# Patient Record
Sex: Female | Born: 1985 | Race: White | Hispanic: No | Marital: Married | Smoking: Never smoker
Health system: Southern US, Community
[De-identification: ages and names within clinical notes are randomized; demographics above are authoritative.]

## PROBLEM LIST (undated history)

## (undated) ENCOUNTER — Emergency Department (HOSPITAL_COMMUNITY): Discharge status: Against Medical Advice | Payer: Self-pay

## (undated) DIAGNOSIS — Z87898 Personal history of other specified conditions: Secondary | ICD-10-CM

## (undated) DIAGNOSIS — E041 Nontoxic single thyroid nodule: Secondary | ICD-10-CM

## (undated) DIAGNOSIS — K625 Hemorrhage of anus and rectum: Secondary | ICD-10-CM

## (undated) DIAGNOSIS — N8000 Endometriosis of the uterus, unspecified: Secondary | ICD-10-CM

## (undated) DIAGNOSIS — K219 Gastro-esophageal reflux disease without esophagitis: Secondary | ICD-10-CM

## (undated) DIAGNOSIS — M779 Enthesopathy, unspecified: Secondary | ICD-10-CM

## (undated) DIAGNOSIS — R87629 Unspecified abnormal cytological findings in specimens from vagina: Secondary | ICD-10-CM

## (undated) DIAGNOSIS — T4145XA Adverse effect of unspecified anesthetic, initial encounter: Secondary | ICD-10-CM

## (undated) DIAGNOSIS — T8859XA Other complications of anesthesia, initial encounter: Secondary | ICD-10-CM

## (undated) HISTORY — DX: Hemorrhage of anus and rectum: K62.5

## (undated) HISTORY — PX: TONSILLECTOMY: SUR1361

## (undated) HISTORY — DX: Unspecified abnormal cytological findings in specimens from vagina: R87.629

## (undated) HISTORY — DX: Nontoxic single thyroid nodule: E04.1

## (undated) HISTORY — DX: Personal history of other specified conditions: Z87.898

## (undated) HISTORY — DX: Enthesopathy, unspecified: M77.9

---
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
## 2006-01-07 DIAGNOSIS — Z87898 Personal history of other specified conditions: Secondary | ICD-10-CM

## 2006-01-07 HISTORY — DX: Personal history of other specified conditions: Z87.898

## 2006-06-19 ENCOUNTER — Ambulatory Visit: Payer: Self-pay | Admitting: Unknown Physician Specialty

## 2006-11-25 ENCOUNTER — Ambulatory Visit: Payer: Self-pay | Admitting: Family Medicine

## 2008-01-06 ENCOUNTER — Ambulatory Visit: Payer: Self-pay | Admitting: Family Medicine

## 2008-01-08 DIAGNOSIS — E041 Nontoxic single thyroid nodule: Secondary | ICD-10-CM

## 2008-01-08 HISTORY — DX: Nontoxic single thyroid nodule: E04.1

## 2008-02-03 ENCOUNTER — Ambulatory Visit: Payer: Self-pay | Admitting: Family Medicine

## 2008-12-29 ENCOUNTER — Ambulatory Visit: Payer: Self-pay | Admitting: Internal Medicine

## 2009-01-02 ENCOUNTER — Ambulatory Visit: Payer: Self-pay | Admitting: Internal Medicine

## 2009-05-04 ENCOUNTER — Emergency Department: Payer: Self-pay | Admitting: Emergency Medicine

## 2009-05-05 ENCOUNTER — Ambulatory Visit: Payer: Self-pay | Admitting: Family Medicine

## 2009-05-29 ENCOUNTER — Ambulatory Visit: Payer: Self-pay | Admitting: Otolaryngology

## 2009-06-30 ENCOUNTER — Ambulatory Visit: Payer: Self-pay | Admitting: Unknown Physician Specialty

## 2009-08-11 ENCOUNTER — Ambulatory Visit: Payer: Self-pay | Admitting: Unknown Physician Specialty

## 2010-06-01 ENCOUNTER — Ambulatory Visit: Payer: Self-pay | Admitting: Otolaryngology

## 2010-07-06 ENCOUNTER — Ambulatory Visit: Payer: Self-pay | Admitting: Internal Medicine

## 2010-07-16 ENCOUNTER — Ambulatory Visit: Payer: Self-pay | Admitting: Internal Medicine

## 2011-04-08 ENCOUNTER — Ambulatory Visit: Payer: Self-pay

## 2011-06-14 ENCOUNTER — Ambulatory Visit: Payer: Self-pay | Admitting: Otolaryngology

## 2011-07-02 ENCOUNTER — Other Ambulatory Visit: Payer: Self-pay | Admitting: *Deleted

## 2011-07-02 MED ORDER — OMEPRAZOLE 20 MG PO CPDR: 20.0000 mg | DELAYED_RELEASE_CAPSULE | Freq: Every day | ORAL | Status: DC

## 2011-12-13 ENCOUNTER — Ambulatory Visit: Payer: Self-pay | Admitting: Internal Medicine

## 2012-03-25 ENCOUNTER — Observation Stay: Payer: Self-pay | Admitting: Obstetrics and Gynecology

## 2012-03-26 ENCOUNTER — Observation Stay: Payer: Self-pay | Admitting: Obstetrics and Gynecology

## 2012-03-26 LAB — URINALYSIS, COMPLETE
Bacteria: NONE SEEN
Bilirubin,UR: NEGATIVE
Glucose,UR: NEGATIVE mg/dL (ref 0–75)
RBC,UR: 1 /HPF (ref 0–5)

## 2012-03-27 ENCOUNTER — Ambulatory Visit: Payer: Self-pay | Admitting: Obstetrics and Gynecology

## 2012-06-11 ENCOUNTER — Inpatient Hospital Stay: Payer: Self-pay | Admitting: Obstetrics and Gynecology

## 2012-06-11 DIAGNOSIS — Q433 Congenital malformations of intestinal fixation: Secondary | ICD-10-CM

## 2012-06-11 LAB — CBC WITH DIFFERENTIAL/PLATELET
Basophil #: 0 10*3/uL (ref 0.0–0.1)
Eosinophil %: 1.5 %
HCT: 36.7 % (ref 35.0–47.0)
MCH: 30.1 pg (ref 26.0–34.0)
Neutrophil #: 8.3 10*3/uL — ABNORMAL HIGH (ref 1.4–6.5)
WBC: 12.4 10*3/uL — ABNORMAL HIGH (ref 3.6–11.0)

## 2012-06-11 LAB — PLATELET COUNT: Platelet: 70 10*3/uL — ABNORMAL LOW (ref 150–440)

## 2012-06-12 LAB — HEMATOCRIT: HCT: 32.7 % — ABNORMAL LOW (ref 35.0–47.0)

## 2013-02-16 ENCOUNTER — Other Ambulatory Visit: Payer: Self-pay | Admitting: Obstetrics and Gynecology

## 2013-02-16 LAB — T4, FREE: FREE THYROXINE: 0.96 ng/dL (ref 0.76–1.46)

## 2013-02-16 LAB — TSH: Thyroid Stimulating Horm: 0.871 u[IU]/mL

## 2013-02-22 IMAGING — US US THYROID
1 series · 17 of 25 positions shown · non-contrast
Comparison: none

REASON FOR EXAM: thyroid nodule
COMMENTS:

[Series 1: us thyroid · 17 of 52 slices shown]
[im 1/52]
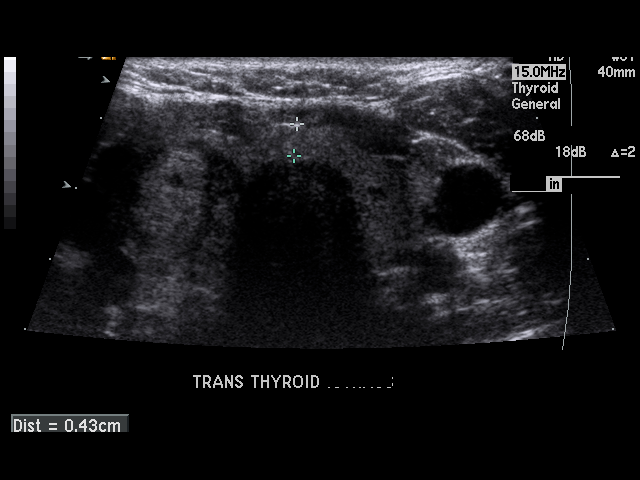
[im 5/52]
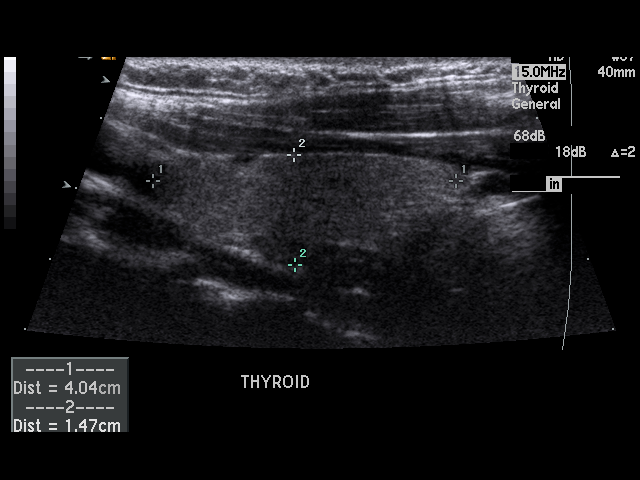
[im 7/52]
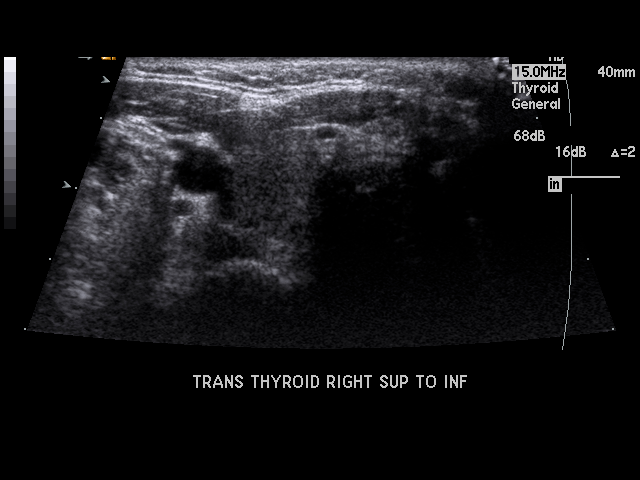
[im 11/52]
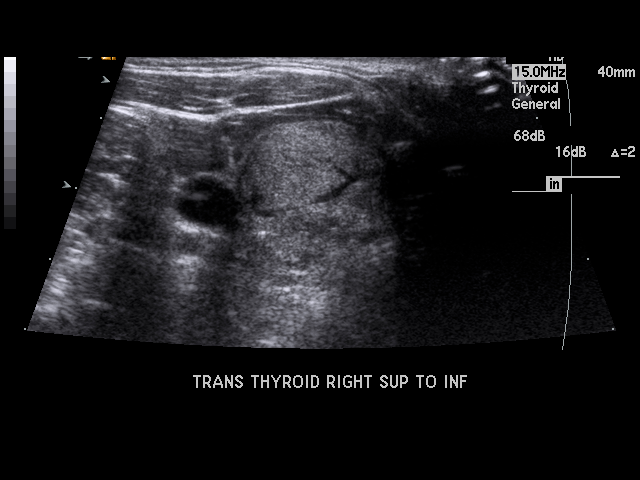
[im 13/52]
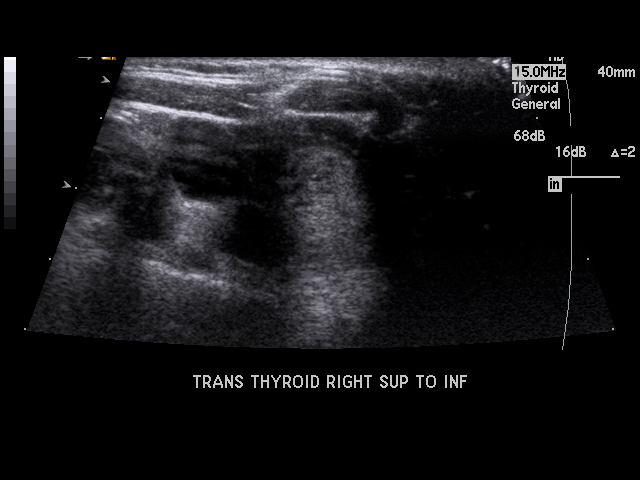
[im 18/52]
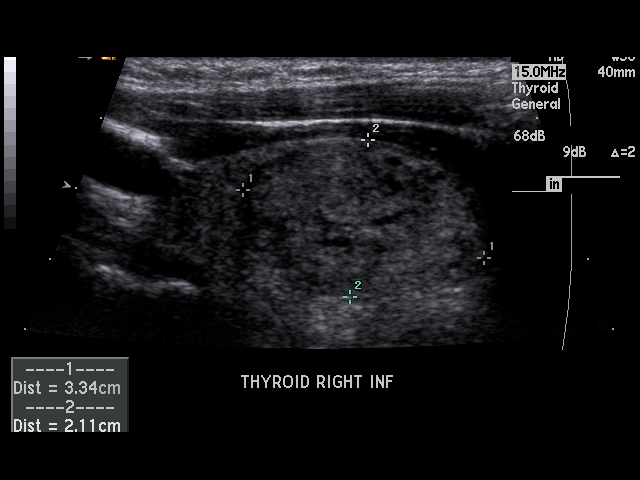
[im 20/52]
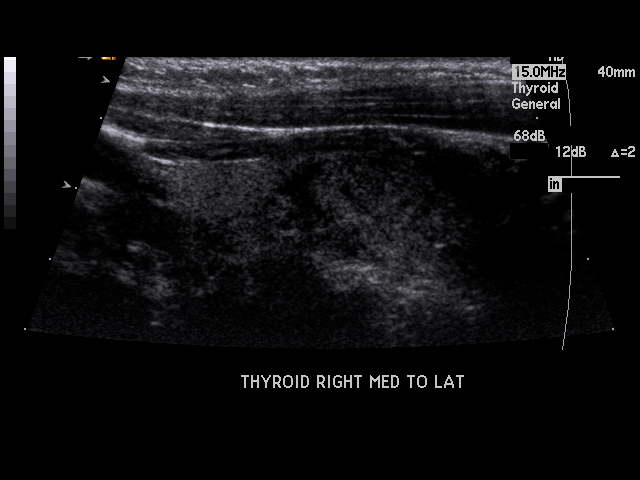
[im 24/52]
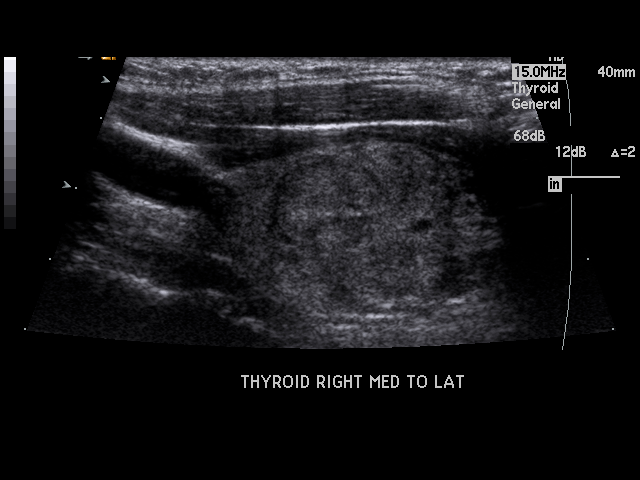
[im 26/52]
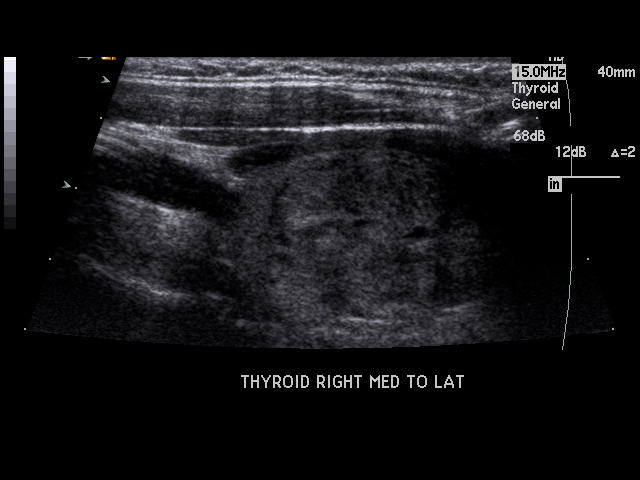
[im 28/52]
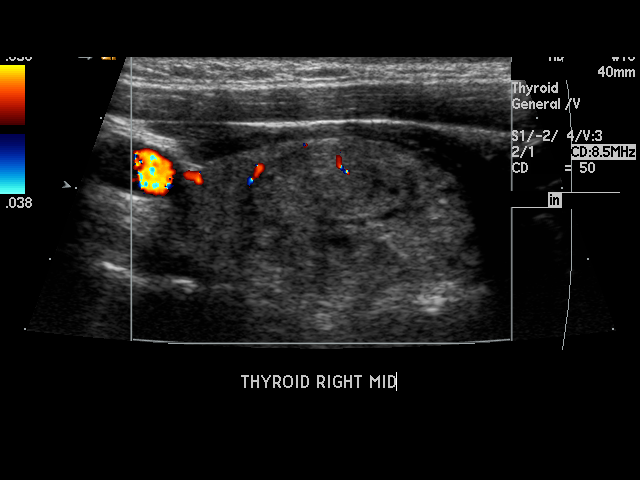
[im 32/52]
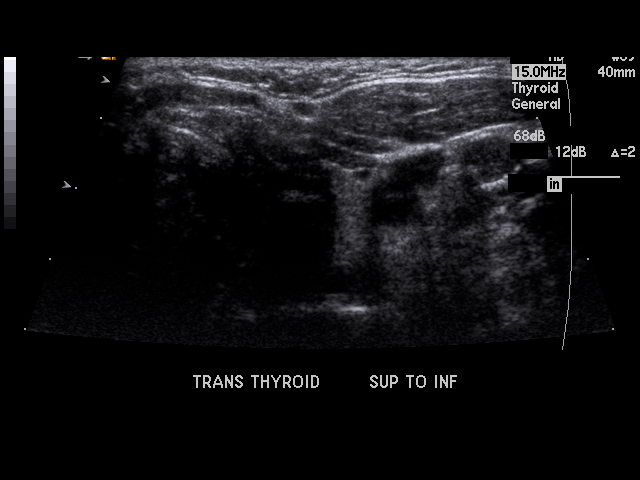
[im 35/52]
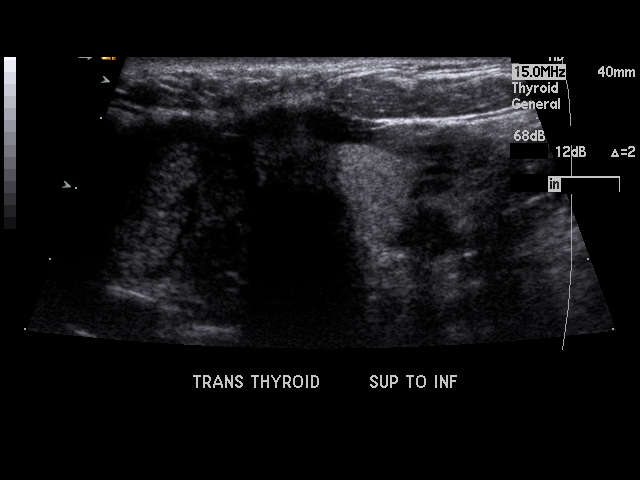
[im 39/52]
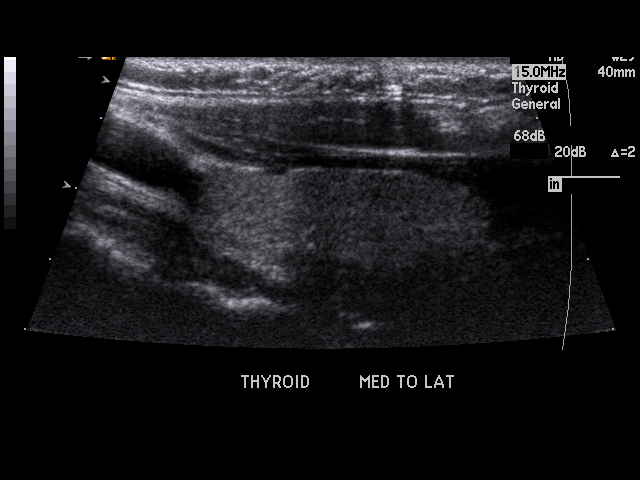
[im 41/52]
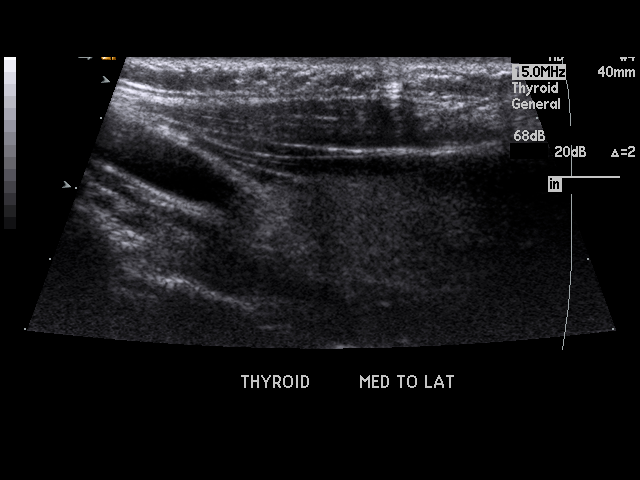
[im 45/52]
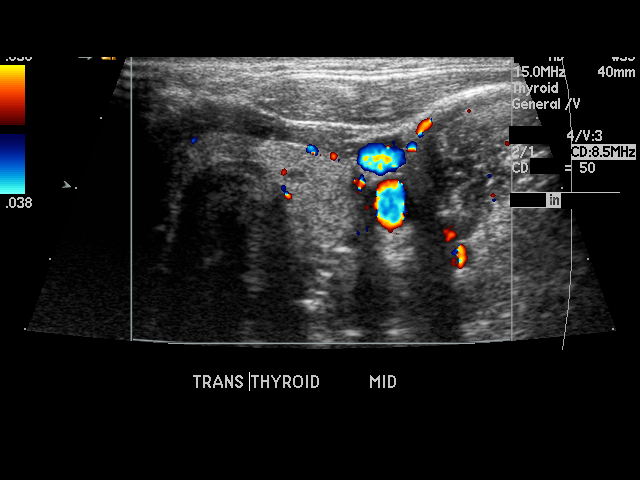
[im 47/52]
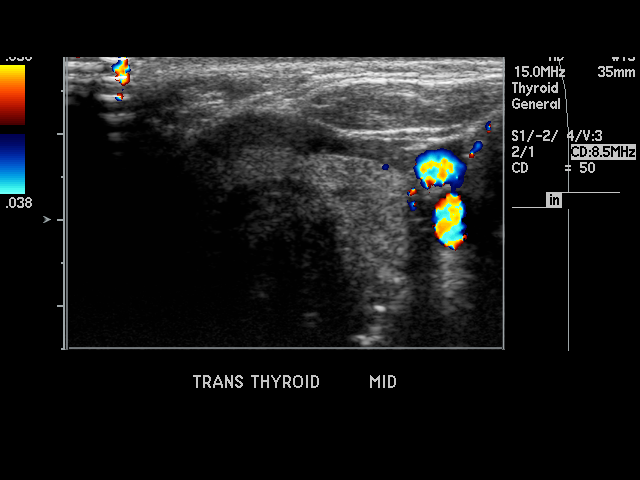
[im 52/52]
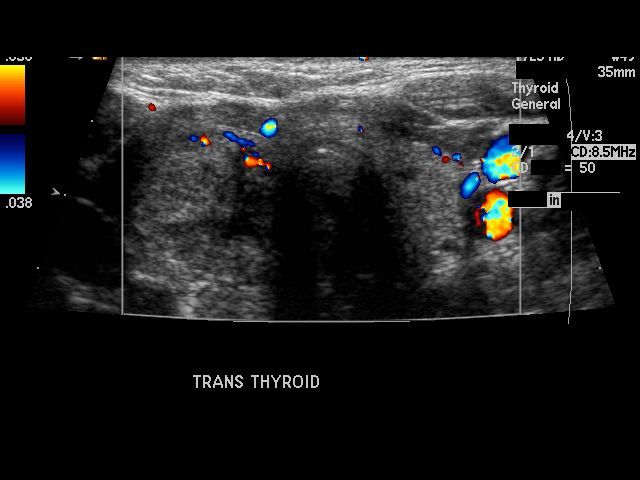

[17 of 25 positions shown; findings below may reference images not displayed]

PROCEDURE:     NUOMABMWLT - NUOMABMWLT THYROID  - June 01, 2010  [DATE]

RESULT:     The right lobe of the thyroid measures 4.64 cm x 1.94 cm x
cm and the left lobe measures 4.55 cm x 1.2 cm x 1.3 cm. At the lower pole
of the right lobe, there is a solid appearing, smoothly marginated,
hypoechoic nodule that measures 3.34 cm x 2.11 cm x 2.2 cm. The nodule is
essentially unchanged in appearance as compared to the prior exam of May 29, 2009. No calcifications associated with the nodule are seen. No new nodules
are identified in the right lobe. In the left lobe, there is noted a tiny,
hypoechoic nodule that measures 3.3 mm in diameter. No thyroid
calcifications are identified within this tiny nodule. No calcifications are
noted in the thyroid tissue peripheral to the nodules on either side. The
thyroid echotexture peripheral to the nodules is homogeneous.
IMPRESSION: 1.  There is a stable appearing, solid nodule at the lower pole of the right
lobe that measures 3.34 cm at maximum diameter and which has not changed
appreciably as compared to the prior exam of [DATE] [DATE], [DATE].
[DATE].  A tiny, 3.3 mm, hypoechoic nodule is noted in the left lobe on this exam.
3.  No thyroid calcifications are identified on either side.

## 2013-05-12 ENCOUNTER — Emergency Department: Payer: Self-pay | Admitting: Emergency Medicine

## 2014-02-22 LAB — HM PAP SMEAR: HM Pap smear: NEGATIVE

## 2014-03-07 ENCOUNTER — Ambulatory Visit: Payer: Self-pay | Admitting: Otolaryngology

## 2014-05-17 NOTE — H&P (Signed)
L&D Evaluation:  History:  HPI G1P0 27.4 week Intrauterine pregnancy   Presents with contractions   Patient's Medical History No Chronic Illness   Patient's Surgical History none   Medications Pre [place] Vitamins   Social History none   Family History Non-Contributory   Exam:  Vital Signs stable   Urine Protein negative dipstick, ketones +   General no apparent distress   Abdomen gravid, non-tender   Estimated Fetal Weight Average for gestational age   Fundal Height 28 cm   Back no CVAT   Edema no edema   Pelvic no external lesions, 30/closed/-3   Mebranes Intact   FHT normal rate with no decels   Fetal Heart Rate 148   Ucx regular, Resolved with IVF and Terbutalinex 1   Skin dry   Lymph no lymphadenopathy   Other Ffn NEGATIVE Cervical Length 3.39 cm by U/S   Impression:  Impression dehydration, Contractions, resolved with Hydratiopn and Terbutaline 0.25 mg subcutaneous x 1 dose   Plan:  Plan discharge   Comments Maintain hydration. Pelvic rest. PML precautions. No work til after f/u in 5 days.   Electronic Signatures: Shamarie Call, Alanda Slim (MD)  (Signed 19-Mar-14 17:22)  Authored: L&D Evaluation   Last Updated: 19-Mar-14 17:22 by Samon Dishner, Alanda Slim (MD)

## 2014-05-17 NOTE — H&P (Signed)
L&D Evaluation:  History:  HPI G1P0 27.5 week Intrauterine pregnancy   Presents with contractions, recurrent   Patient's Medical History No Chronic Illness   Patient's Surgical History none   Medications Pre [place] Vitamins   Social History none   Family History Non-Contributory   Exam:  Vital Signs stable   Urine Protein negative dipstick   General no apparent distress   Abdomen gravid, non-tender   Estimated Fetal Weight Average for gestational age   Fundal Height 28 cm   Back no CVAT   Edema no edema   Pelvic no external lesions, 30/closed/-3   Mebranes Intact   FHT normal rate with no decels   Fetal Heart Rate 148   Ucx regular, Resolved with po hydration and Terbutalinex 1   Skin dry   Lymph no lymphadenopathy   Other Ffn NEGATIVE (3/19) Cervical Length 3.39 cm by U/S (3/19)   Impression:  Impression dehydration, Contractions, resolved with Hydratiopn and Terbutaline 0.25 mg subcutaneous x 1 dose   Plan:  Plan discharge, betamethasone 12.5 mg im q day x 2   Comments Maintain hydration. Pelvic rest. PML precautions. No work til after f/u in 4 days.   Follow Up Appointment already scheduled   Electronic Signatures: Richmond Coldren, Alanda Slim (MD)  (Signed 20-Mar-14 13:17)  Authored: L&D Evaluation   Last Updated: 20-Mar-14 13:17 by Shalika Arntz, Alanda Slim (MD)

## 2014-07-12 ENCOUNTER — Encounter
Admission: RE | Admit: 2014-07-12 | Discharge: 2014-07-12 | Disposition: A | Discharge status: Home / Self Care | Payer: 59 | Source: Ambulatory Visit | Attending: Otolaryngology | Admitting: Otolaryngology

## 2014-07-12 DIAGNOSIS — Z01812 Encounter for preprocedural laboratory examination: Secondary | ICD-10-CM | POA: Diagnosis not present

## 2014-07-12 HISTORY — DX: Gastro-esophageal reflux disease without esophagitis: K21.9

## 2014-07-12 HISTORY — DX: Other complications of anesthesia, initial encounter: T88.59XA

## 2014-07-12 HISTORY — DX: Adverse effect of unspecified anesthetic, initial encounter: T41.45XA

## 2014-07-12 HISTORY — DX: Nontoxic single thyroid nodule: E04.1

## 2014-07-12 HISTORY — DX: Endometriosis of uterus: N80.0

## 2014-07-12 HISTORY — DX: Endometriosis of the uterus, unspecified: N80.00

## 2014-07-12 NOTE — Patient Instructions (Signed)
  Your procedure is scheduled on: Wednesday July 20, 2914. Report to Same Day Surgery. To find out your arrival time please call [phone] between 1PM - 3PM on Tuesday July 19, 2014.  Remember: Instructions that are not followed completely may result in serious medical risk, up to and including death, or upon the discretion of your surgeon and anesthesiologist your surgery may need to be rescheduled.    __x__ 1. Do not eat food or drink liquids after midnight. No gum chewing or hard candies.     __x__ 2. No Alcohol for 24 hours before or after surgery.   ____ 3. Bring all medications with you on the day of surgery if instructed.    __x__ 4. Notify your doctor if there is any change in your medical condition     (cold, fever, infections).     Do not wear jewelry, make-up, hairpins, clips or nail polish.  Do not wear lotions, powders, or perfumes. You may wear deodorant.  Do not shave 48 hours prior to surgery. Men may shave face and neck.  Do not bring valuables to the hospital.    [facility] is not responsible for any belongings or valuables.               Contacts, dentures or bridgework may not be worn into surgery.  Leave your suitcase in the car. After surgery it may be brought to your room.  For patients admitted to the hospital, discharge time is determined by your treatment team.   Patients discharged the day of surgery will not be allowed to drive home.    Please read over the following fact sheets that you were given:   [facility] Preparing for Surgery  __x__ Take these medicines the morning of surgery with A SIP OF WATER:    1. Yaz  2. famotidine (PEPCID)   ____ Fleet Enema (as directed)   _x___ Use CHG Soap as directed  ____ Use inhalers on the day of surgery  ____ Stop metformin 2 days prior to surgery    ____ Take 1/2 of usual insulin dose the night before surgery and none on the morning of surgery.   ____ Stop Coumadin/Plavix/aspirin on Does Not  apply.  __x__ Stop Anti-inflammatories on Today.  May take Tylenol for pain.   ____ Stop supplements until after surgery.    ____ Bring C-Pap to the hospital.

## 2014-07-20 ENCOUNTER — Ambulatory Visit: Payer: 59 | Admitting: Anesthesiology

## 2014-07-20 ENCOUNTER — Encounter: Payer: Self-pay | Admitting: Anesthesiology

## 2014-07-20 ENCOUNTER — Observation Stay
Admission: RE | Admit: 2014-07-20 | Discharge: 2014-07-21 | Disposition: A | Discharge status: Home / Self Care | Payer: 59 | Source: Ambulatory Visit | Attending: Otolaryngology | Admitting: Otolaryngology

## 2014-07-20 ENCOUNTER — Encounter
Admission: RE | Disposition: A | Discharge status: Home / Self Care | Payer: Self-pay | Source: Ambulatory Visit | Attending: Otolaryngology

## 2014-07-20 DIAGNOSIS — Z8249 Family history of ischemic heart disease and other diseases of the circulatory system: Secondary | ICD-10-CM | POA: Insufficient documentation

## 2014-07-20 DIAGNOSIS — D34 Benign neoplasm of thyroid gland: Secondary | ICD-10-CM | POA: Diagnosis not present

## 2014-07-20 DIAGNOSIS — Z9889 Other specified postprocedural states: Secondary | ICD-10-CM | POA: Insufficient documentation

## 2014-07-20 DIAGNOSIS — Z833 Family history of diabetes mellitus: Secondary | ICD-10-CM | POA: Diagnosis not present

## 2014-07-20 DIAGNOSIS — C73 Malignant neoplasm of thyroid gland: Secondary | ICD-10-CM | POA: Diagnosis not present

## 2014-07-20 DIAGNOSIS — Z793 Long term (current) use of hormonal contraceptives: Secondary | ICD-10-CM | POA: Diagnosis not present

## 2014-07-20 DIAGNOSIS — Z8489 Family history of other specified conditions: Secondary | ICD-10-CM | POA: Diagnosis not present

## 2014-07-20 DIAGNOSIS — E041 Nontoxic single thyroid nodule: Secondary | ICD-10-CM | POA: Diagnosis present

## 2014-07-20 HISTORY — PX: THYROIDECTOMY: SHX17

## 2014-07-20 LAB — POCT PREGNANCY, URINE: Preg Test, Ur: NEGATIVE

## 2014-07-20 LAB — CALCIUM: Calcium: 8.4 mg/dL — ABNORMAL LOW (ref 8.9–10.3)

## 2014-07-20 SURGERY — THYROIDECTOMY
Anesthesia: General | Wound class: Clean

## 2014-07-20 MED ORDER — LIDOCAINE HCL (CARDIAC) 20 MG/ML IV SOLN
INTRAVENOUS | Status: DC | PRN
  Administered 2014-07-20: 50 mg via INTRAVENOUS

## 2014-07-20 MED ORDER — HYDROCODONE-ACETAMINOPHEN 5-325 MG PO TABS
1.0000 | ORAL_TABLET | ORAL | Status: DC | PRN
  Administered 2014-07-20 – 2014-07-21 (×2): 2 via ORAL
  Filled 2014-07-20 (×2): qty 2

## 2014-07-20 MED ORDER — PROPOFOL 10 MG/ML IV BOLUS
INTRAVENOUS | Status: DC | PRN
  Administered 2014-07-20: 150 mg via INTRAVENOUS
  Administered 2014-07-20: 50 mg via INTRAVENOUS

## 2014-07-20 MED ORDER — DEXAMETHASONE SODIUM PHOSPHATE 4 MG/ML IJ SOLN
INTRAMUSCULAR | Status: DC | PRN
  Administered 2014-07-20: 10 mg via INTRAVENOUS

## 2014-07-20 MED ORDER — ONDANSETRON HCL 4 MG PO TABS: 4.0000 mg | ORAL_TABLET | ORAL | Status: DC | PRN

## 2014-07-20 MED ORDER — ONDANSETRON HCL 4 MG/2ML IJ SOLN: 4.0000 mg | Freq: Once | INTRAMUSCULAR | Status: DC | PRN

## 2014-07-20 MED ORDER — MORPHINE SULFATE 2 MG/ML IJ SOLN
2.0000 mg | INTRAMUSCULAR | Status: DC | PRN
  Administered 2014-07-20: 2 mg via INTRAVENOUS
  Filled 2014-07-20: qty 1

## 2014-07-20 MED ORDER — BACITRACIN ZINC 500 UNIT/GM EX OINT
TOPICAL_OINTMENT | CUTANEOUS | Status: AC
Start: 2014-07-20 — End: 2014-07-20
  Filled 2014-07-20: qty 28.35

## 2014-07-20 MED ORDER — FENTANYL CITRATE (PF) 100 MCG/2ML IJ SOLN
25.0000 ug | INTRAMUSCULAR | Status: DC | PRN
  Administered 2014-07-20 (×4): 25 ug via INTRAVENOUS

## 2014-07-20 MED ORDER — MIDAZOLAM HCL 2 MG/2ML IJ SOLN
INTRAMUSCULAR | Status: DC | PRN
  Administered 2014-07-20: 2 mg via INTRAVENOUS

## 2014-07-20 MED ORDER — LACTATED RINGERS IV SOLN
INTRAVENOUS | Status: DC
  Administered 2014-07-20 (×3): via INTRAVENOUS

## 2014-07-20 MED ORDER — PHENYLEPHRINE HCL 10 MG/ML IJ SOLN
INTRAMUSCULAR | Status: DC | PRN
  Administered 2014-07-20: 100 ug via INTRAVENOUS

## 2014-07-20 MED ORDER — KCL IN DEXTROSE-NACL 20-5-0.45 MEQ/L-%-% IV SOLN
INTRAVENOUS | Status: DC
  Administered 2014-07-20 (×2): via INTRAVENOUS
  Filled 2014-07-20 (×6): qty 1000

## 2014-07-20 MED ORDER — CALCIUM CARBONATE-VITAMIN D 500-200 MG-UNIT PO TABS
2.0000 | ORAL_TABLET | Freq: Two times a day (BID) | ORAL | Status: DC
  Administered 2014-07-20: 2 via ORAL
  Filled 2014-07-20: qty 2

## 2014-07-20 MED ORDER — BACITRACIN ZINC 500 UNIT/GM EX OINT
1.0000 "application " | TOPICAL_OINTMENT | Freq: Three times a day (TID) | CUTANEOUS | Status: DC
  Administered 2014-07-20 – 2014-07-21 (×3): 1 via TOPICAL
  Filled 2014-07-20: qty 28.35

## 2014-07-20 MED ORDER — LIDOCAINE-EPINEPHRINE (PF) 1 %-1:200000 IJ SOLN
INTRAMUSCULAR | Status: AC
  Filled 2014-07-20: qty 30

## 2014-07-20 MED ORDER — ONDANSETRON HCL 4 MG/2ML IJ SOLN
4.0000 mg | INTRAMUSCULAR | Status: DC | PRN
  Administered 2014-07-20: 4 mg via INTRAVENOUS
  Filled 2014-07-20: qty 2

## 2014-07-20 MED ORDER — FENTANYL CITRATE (PF) 100 MCG/2ML IJ SOLN
INTRAMUSCULAR | Status: AC
  Administered 2014-07-20: 16:00:00
  Filled 2014-07-20: qty 2

## 2014-07-20 MED ORDER — FENTANYL CITRATE (PF) 100 MCG/2ML IJ SOLN
INTRAMUSCULAR | Status: DC | PRN
  Administered 2014-07-20 (×2): 50 ug via INTRAVENOUS
  Administered 2014-07-20: 100 ug via INTRAVENOUS

## 2014-07-20 MED ORDER — SUCCINYLCHOLINE CHLORIDE 20 MG/ML IJ SOLN
INTRAMUSCULAR | Status: DC | PRN
  Administered 2014-07-20: 60 mg via INTRAVENOUS
  Administered 2014-07-20: 80 mg via INTRAVENOUS

## 2014-07-20 MED ORDER — LIDOCAINE-EPINEPHRINE (PF) 1 %-1:200000 IJ SOLN
INTRAMUSCULAR | Status: DC | PRN
  Administered 2014-07-20: 2.5 mL

## 2014-07-20 MED ORDER — ONDANSETRON HCL 4 MG/2ML IJ SOLN
INTRAMUSCULAR | Status: DC | PRN
  Administered 2014-07-20: 4 mg via INTRAVENOUS

## 2014-07-20 MED ORDER — BACITRACIN 500 UNIT/GM EX OINT
TOPICAL_OINTMENT | CUTANEOUS | Status: DC | PRN
  Administered 2014-07-20: 1 via TOPICAL

## 2014-07-20 SURGICAL SUPPLY — 33 items
BLADE SURG 15 STRL LF DISP TIS (BLADE) ×1 IMPLANT
BLADE SURG 15 STRL SS (BLADE) ×1
CANISTER SUCT 1200ML W/VALVE (MISCELLANEOUS) ×2 IMPLANT
CORD BIP STRL DISP 12FT (MISCELLANEOUS) ×2 IMPLANT
DRAIN TLS ROUND 10FR (DRAIN) IMPLANT
DRAPE MAG INST 16X20 L/F (DRAPES) ×2 IMPLANT
DRSG TEGADERM 2-3/8X2-3/4 SM (GAUZE/BANDAGES/DRESSINGS) ×2 IMPLANT
ELECT LARYNGEAL 6/7 (MISCELLANEOUS) ×2
ELECT LARYNGEAL 8/9 (MISCELLANEOUS) ×2
ELECTRODE LARYNGEAL 6/7 (MISCELLANEOUS) ×1 IMPLANT
ELECTRODE LARYNGEAL 8/9 (MISCELLANEOUS) ×1 IMPLANT
FORCEPS JEWEL BIP 4-3/4 STR (INSTRUMENTS) ×2 IMPLANT
GLOVE BIO SURGEON STRL SZ7.5 (GLOVE) ×6 IMPLANT
GOWN STRL REUS W/ TWL LRG LVL3 (GOWN DISPOSABLE) ×3 IMPLANT
GOWN STRL REUS W/TWL LRG LVL3 (GOWN DISPOSABLE) ×3
HARMONIC SCALPEL FOCUS (MISCELLANEOUS) ×2 IMPLANT
HEMOSTAT SURGICEL 2X3 (HEMOSTASIS) IMPLANT
HOOK STAY BLUNT/RETRACTOR 5M (MISCELLANEOUS) ×2 IMPLANT
KIT RM TURNOVER STRD PROC AR (KITS) ×2 IMPLANT
LABEL OR SOLS (LABEL) ×2 IMPLANT
NS IRRIG 500ML POUR BTL (IV SOLUTION) ×2 IMPLANT
PACK HEAD/NECK (MISCELLANEOUS) ×2 IMPLANT
PAD GROUND ADULT SPLIT (MISCELLANEOUS) ×2 IMPLANT
PROBE NEUROSIGN BIPOL (MISCELLANEOUS) ×1 IMPLANT
PROBE NEUROSIGN BIPOLAR (MISCELLANEOUS) ×1
SPONGE KITTNER 5P (MISCELLANEOUS) ×4 IMPLANT
SPONGE XRAY 4X4 16PLY STRL (MISCELLANEOUS) ×2 IMPLANT
SUT PROLENE 3 0 PS 2 (SUTURE) ×2 IMPLANT
SUT SILK 2 0 (SUTURE) ×1
SUT SILK 2 0 SH (SUTURE) ×2 IMPLANT
SUT SILK 2-0 18XBRD TIE 12 (SUTURE) ×1 IMPLANT
SUT VIC AB 4-0 RB1 18 (SUTURE) ×2 IMPLANT
SYSTEM CHEST DRAIN TLS 7FR (DRAIN) IMPLANT

## 2014-07-20 NOTE — H&P (Signed)
History and physical reviewed and will be scanned in later. No change in medical status reported by the patient or family, appears stable for surgery. All questions regarding the procedure answered, and patient (or family if a child) expressed understanding of the procedure.  Anastashia Westerfeld S @TODAY@ 

## 2014-07-20 NOTE — Progress Notes (Signed)
Connie Proctor, Connie Proctor [id number] Oct 28, 1985 Connie Nearing, MD   SUBJECTIVE: S/p total thyroidectomy doing well. Able to tolerate PO diet better this after noon. No hoarseness. Sore but pain is controlled. Medications:  Current Facility-Administered Medications  Medication Dose Route Frequency Provider Last Rate Last Dose  . bacitracin ointment 1 application  1 application Topical 3 times per day Clyde Canterbury, MD   1 application at 99/24/26 1751  . calcium-vitamin D (OSCAL WITH D) 500-200 MG-UNIT per tablet 2 tablet  2 tablet Oral BID WC Clyde Canterbury, MD   2 tablet at 07/20/14 1750  . dextrose 5 % and 0.45 % NaCl with KCl 20 mEq/L infusion   Intravenous Continuous Clyde Canterbury, MD 100 mL/hr at 07/20/14 1240    . HYDROcodone-acetaminophen (NORCO/VICODIN) 5-325 MG per tablet 1-2 tablet  1-2 tablet Oral Q4H PRN Clyde Canterbury, MD   2 tablet at 07/20/14 1751  . morphine 2 MG/ML injection 2-4 mg  2-4 mg Intravenous Q2H PRN Clyde Canterbury, MD   2 mg at 07/20/14 1203  . ondansetron (ZOFRAN) tablet 4 mg  4 mg Oral Q4H PRN Clyde Canterbury, MD       Or  . ondansetron [facility]) injection 4 mg  4 mg Intravenous Q4H PRN Clyde Canterbury, MD   4 mg at 07/20/14 1308  .  Medications Prior to Admission  Medication Sig Dispense Refill  . drospirenone-ethinyl estradiol (YAZ,GIANVI,LORYNA) 3-0.02 MG tablet Take 1 tablet by mouth daily.    . famotidine (PEPCID) 20 MG tablet Take 20 mg by mouth 2 (two) times daily.    Marland Kitchen omeprazole (PRILOSEC) 20 MG capsule Take 1 capsule (20 mg total) by mouth daily. (Patient not taking: Reported on 07/20/2014) 30 capsule 6   OBJECTIVE:  PHYSICAL EXAM  Vitals: Blood pressure 110/67, pulse 87, temperature 98.2 F (36.8 C), temperature source Oral, resp. rate 17, height 5\' 4"  (1.626 m), weight 66.679 kg (147 lb), last menstrual period 07/16/2014, SpO2 99 %.. General: Well-developed, Well-nourished in no acute distress Mood: Mood and affect well adjusted, pleasant and cooperative. Orientation:  Grossly alert and oriented. Vocal Quality: No hoarseness. Communicates verbally. head and Face: NCAT. No facial asymmetry. No visible skin lesions. No significant facial scars. No tenderness with sinus percussion. Facial strength normal and symmetric. Neck: Wound clean, dry intact. No swelling in neck. Drains in place. Respiratory: Normal respiratory effort without labored breathing.  MEDICAL DECISION MAKING: Data Review:  Results for orders placed or performed during the hospital encounter of 07/20/14 (from the past 48 hour(s))  Pregnancy, urine POC     Status: None   Collection Time: 07/20/14  6:41 AM  Result Value Ref Range   Preg Test, Ur NEGATIVE NEGATIVE    Comment:        THE SENSITIVITY OF THIS METHODOLOGY IS >24 mIU/mL   Calcium     Status: Abnormal   Collection Time: 07/20/14  3:58 PM  Result Value Ref Range   Calcium 8.4 (L) 8.9 - 10.3 mg/dL  . No results found..   ASSESSMENT: s/p Total thyroidectomy, doing well. Calcium looks OK. Slightly low but on [place]. Recheck in AM. PLAN: Monitor overnight. Recheck Calcium in AM. May remove drains in AM.   Connie Nearing, MD 07/20/2014 6:10 PM

## 2014-07-20 NOTE — Transfer of Care (Signed)
Immediate Anesthesia Transfer of Care Note  Patient: Connie Proctor  Procedure(s) Performed: Procedure(s): THYROIDECTOMY (N/A)  Patient Location: PACU  Anesthesia Type:General  Level of Consciousness: Alert, Awake, Oriented  Airway & Oxygen Therapy: Patient Spontanous Breathing  Post-op Assessment: Report given to RN  Post vital signs: Reviewed and stable  Last Vitals:  Filed Vitals:   07/20/14 1020  BP: 114/70  Pulse: 120  Temp: 36.8 C  Resp: 15    Complications: No apparent anesthesia complications

## 2014-07-20 NOTE — Anesthesia Procedure Notes (Signed)
Procedure Name: Intubation Date/Time: 07/20/2014 7:33 AM Performed by: Eliberto Ivory Pre-anesthesia Checklist: Patient identified, Patient being monitored, Timeout performed, Emergency Drugs available and Suction available Patient Re-evaluated:Patient Re-evaluated prior to inductionOxygen Delivery Method: Circle system utilized Preoxygenation: Pre-oxygenation with 100% oxygen Intubation Type: IV induction Ventilation: Mask ventilation without difficulty Laryngoscope Size: 3, Glidescope and Mac Grade View: Grade II Tube type: Oral Tube size: 7.0 mm Number of attempts: 1 Airway Equipment and Method: Stylet and Video-laryngoscopy Placement Confirmation: ETT inserted through vocal cords under direct vision,  positive ETCO2 and breath sounds checked- equal and bilateral Secured at: 21 cm Tube secured with: Tape Dental Injury: Teeth and Oropharynx as per pre-operative assessment

## 2014-07-20 NOTE — Op Note (Addendum)
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  The patient was taken to the operating room and with the patient in a comfortable supine position,  general endotracheal anesthesia was induced without difficulty.  A nerve monitor on the endotracheal tube was visualized to be between the cords at the time of intubation. A proper time-out was performed, confirming the operative site and procedure.  The patient was placed on a shoulder roll and position. Skin was injected with 1% lidocaine with epinephrine 1-200,000. She was then prepped and draped in the usual sterile fashion. A 15 blade was used to incise the skin carrying the incision down through the subcutaneous tissues. The platysma muscle was divided with the Bovie. The strap muscles were identified in the midline and divided in the midline, retracting them laterally. Small anterior jugular veins were either clamped and suture divided or divided with the Harmonic scalpel. The strap muscles were retracted laterally off of the capsule of the right lobe of the thyroid gland. This was then finger dissected to loosen loose attachments to the gland. Dissection proceeded superiorly, dissecting the superior pole of the gland. The superior pole vessels were divided with the Harmonic scalpel. A small parathyroid gland was visualized and preserved during this dissection. The superior pole was retracted inferiorly and dissection proceeded around the lateral aspect of the gland, dividing vascular attachments with the  Harmonic scalpel. The inferior pole was dissected, identifying a small structure consistent with the parathyroid, and the inferior pole vessels were divided with the Harmonic scalpel. Care was taken to divide all of these vessels right at the capsule of the gland. The gland was then retracted medially and delivered from the wound. Dissection along the trachea revealed the recurrent laryngeal nerve which stimulated properly. The gland was then dissected off of the trachea, dividing Berry's ligament with the nerve carefully protected. The right lobe of the gland was then divided at the isthmus and set aside. It was marked with a suture at the superior pole. Next the left lobe of the thyroid gland was dissected in the same fashion as described above. Once again the superior and inferior pole vessels were divided right at the capsule of the gland and structures consistent with parathyroid tissue were identified both superiorly and inferiorly. Retracting the gland medially the recurrent laryngeal nerve was identified and confirmed with the stimulator. This was then carefully protected as the gland was dissected away from the trachea and Berry's ligament divided. The left lobe was delivered and sent as a specimen. The wound was then irrigated and inspected for bleeding. #10 TLS drains were placed on either side of the trachea with the drains coming out through the skin just below the wound. The drains were secured with 4-0 Vicryl suture. The strap muscles were then reapproximated with 4-0 Vicryl suture. The platysma and subcutaneous tissues were also closed with 4-0 Vicryl suture. The skin was closed with a 3-0 running subcuticular Prolene suture. The patient was then returned to the anesthesiologist for awakening. She was awakened and taken to  recovery room in good condition postoperatively.  The patient was then returned to the anesthesiologist in good condition for awakening. The patient was awakened and taken to the  recovery room in good condition.   Disposition:   PACU then to floor  Plan: Overnight observation to monitor calcium levels  [name] 07/20/2014 10:13 AM

## 2014-07-20 NOTE — Anesthesia Preprocedure Evaluation (Signed)
Anesthesia Evaluation  Patient identified by MRN, date of birth, ID band Patient awake    Reviewed: Allergy & Precautions, NPO status , Patient's Chart, lab work & pertinent test results, reviewed documented beta blocker date and time   Airway Mallampati: II  TM Distance: >3 FB     Dental  (+) Chipped   Pulmonary           Cardiovascular      Neuro/Psych    GI/Hepatic   Endo/Other    Renal/GU      Musculoskeletal   Abdominal   Peds  Hematology   Anesthesia Other Findings   Reproductive/Obstetrics                             Anesthesia Physical Anesthesia Plan  ASA: II  Anesthesia Plan: General   Post-op Pain Management:    Induction: Intravenous  Airway Management Planned: Oral ETT  Additional Equipment:   Intra-op Plan:   Post-operative Plan:   Informed Consent: I have reviewed the patients History and Physical, chart, labs and discussed the procedure including the risks, benefits and alternatives for the proposed anesthesia with the patient or authorized representative who has indicated his/her understanding and acceptance.     Plan Discussed with: CRNA  Anesthesia Plan Comments:         Anesthesia Quick Evaluation  

## 2014-07-20 NOTE — Anesthesia Postprocedure Evaluation (Signed)
  Anesthesia Post-op Note  Patient: Connie Proctor  Procedure(s) Performed: Procedure(s): THYROIDECTOMY (N/A)  Anesthesia type:General  Patient location: PACU  Post pain: Pain level controlled  Post assessment: Post-op Vital signs reviewed, Patient's Cardiovascular Status Stable, Respiratory Function Stable, Patent Airway and No signs of Nausea or vomiting  Post vital signs: Reviewed and stable  Last Vitals:  Filed Vitals:   07/20/14 1105  BP: 106/66  Pulse: 77  Temp:   Resp: 18    Level of consciousness: awake, alert  and patient cooperative  Complications: No apparent anesthesia complications

## 2014-07-21 DIAGNOSIS — C73 Malignant neoplasm of thyroid gland: Secondary | ICD-10-CM | POA: Diagnosis not present

## 2014-07-21 LAB — CALCIUM: CALCIUM: 8.6 mg/dL — AB (ref 8.9–10.3)

## 2014-07-21 LAB — SURGICAL PATHOLOGY

## 2014-07-21 MED ORDER — HYDROCODONE-ACETAMINOPHEN 5-325 MG PO TABS: 1.0000 | ORAL_TABLET | ORAL | Status: DC | PRN

## 2014-07-21 MED ORDER — CALCIUM CARBONATE-VITAMIN D 500-200 MG-UNIT PO TABS: 2.0000 | ORAL_TABLET | Freq: Two times a day (BID) | ORAL | Status: DC

## 2014-07-21 NOTE — Assessment & Plan Note (Signed)
Connie Proctor, Hammer [id number] [birth year] Connie Nearing, MD    SUBJECTIVE: This 29 y.o. year old female is status post THYROIDECTOMY. Doing well today. Drains still with moderate output. Calcium from AM not back yet.  Medications:  Current Facility-Administered Medications  Medication Dose Route Frequency Provider Last Rate Last Dose  . bacitracin ointment 1 application  1 application Topical 3 times per day Clyde Canterbury, MD   1 application at 66/44/03 [phone]  . calcium-vitamin D (OSCAL WITH D) 500-200 MG-UNIT per tablet 2 tablet  2 tablet Oral BID WC Clyde Canterbury, MD   2 tablet at 07/20/14 1750  . dextrose 5 % and 0.45 % NaCl with KCl 20 mEq/L infusion   Intravenous Continuous Clyde Canterbury, MD 100 mL/hr at 07/20/14 2302    . HYDROcodone-acetaminophen (NORCO/VICODIN) 5-325 MG per tablet 1-2 tablet  1-2 tablet Oral Q4H PRN Clyde Canterbury, MD   2 tablet at 07/21/14 0442  . morphine 2 MG/ML injection 2-4 mg  2-4 mg Intravenous Q2H PRN Clyde Canterbury, MD   2 mg at 07/20/14 1203  . ondansetron (ZOFRAN) tablet 4 mg  4 mg Oral Q4H PRN Clyde Canterbury, MD       Or  . ondansetron [facility]) injection 4 mg  4 mg Intravenous Q4H PRN Clyde Canterbury, MD   4 mg at 07/20/14 1308  .  Medications Prior to Admission  Medication Sig Dispense Refill  . drospirenone-ethinyl estradiol (YAZ,GIANVI,LORYNA) 3-0.02 MG tablet Take 1 tablet by mouth daily.    . famotidine (PEPCID) 20 MG tablet Take 20 mg by mouth 2 (two) times daily.    Marland Kitchen omeprazole (PRILOSEC) 20 MG capsule Take 1 capsule (20 mg total) by mouth daily. (Patient not taking: Reported on 07/20/2014) 30 capsule 6    OBJECTIVE:  PHYSICAL EXAM  Vitals: Blood pressure 110/58, pulse 71, temperature 97.9 F (36.6 C), temperature source Oral, resp. rate 18, height 5\' 4"  (1.626 m), weight 66.679 kg (147 lb), last menstrual period 07/16/2014, SpO2 100 %.. General: Well-developed, Well-nourished in no acute distress Mood: Mood and affect well adjusted, pleasant and  cooperative. Orientation: Grossly alert and oriented. Vocal Quality: No hoarseness. Communicates verbally. head and Face: NCAT. No facial asymmetry. No visible skin lesions. No significant facial scars. No tenderness with sinus percussion. Facial strength normal and symmetric. Neck: Supple and symmetric with no palpable masses, tenderness or crepitance. The trachea is midline. Thyroid wound is clean, dry, intact. Left drain was removed, contained scant serous fluid. No neck swelling. Respiratory: Normal respiratory effort without labored breathing.  MEDICAL DECISION MAKING: Data Review:  Results for orders placed or performed during the hospital encounter of 07/20/14 (from the past 48 hour(s))  Pregnancy, urine POC     Status: None   Collection Time: 07/20/14  6:41 AM  Result Value Ref Range   Preg Test, Ur NEGATIVE NEGATIVE    Comment:        THE SENSITIVITY OF THIS METHODOLOGY IS >24 mIU/mL   Calcium     Status: Abnormal   Collection Time: 07/20/14  3:58 PM  Result Value Ref Range   Calcium 8.4 (L) 8.9 - 10.3 mg/dL  . No results found..   ASSESSMENT: Doing well after total thyroidectomy. No symptoms of hypocalcemia. Chvostek's sign negative.  PLAN: Anticipate d/c later today if calcium reasonably stable. If mildly decreased from last PM, could d/c with increased PO calcium to TID. Drain out in International aid/development worker.   Connie Nearing, MD 07/21/2014 8:12 AM

## 2014-07-21 NOTE — Progress Notes (Signed)
Patient discharged to home, instructions given on emptying J.P. drain. Dressing to neck intact no drainage noted. Patient denies pain at this time. Prescriptions given as ordered. Patient also given output sheet to record output to given to doctor on follow up appointment. Patient taken home by her mother and husband. Patient is alert and oriented, ambulates well without assistance.

## 2014-12-29 DIAGNOSIS — K219 Gastro-esophageal reflux disease without esophagitis: Secondary | ICD-10-CM | POA: Insufficient documentation

## 2015-02-01 ENCOUNTER — Telehealth: Payer: Self-pay | Admitting: Obstetrics and Gynecology

## 2015-02-01 MED ORDER — DROSPIRENONE-ETHINYL ESTRADIOL 3-0.02 MG PO TABS: 1.0000 | ORAL_TABLET | Freq: Every day | ORAL | Status: DC

## 2015-02-01 NOTE — Telephone Encounter (Signed)
Pt has genericYaz RX and will be out of it the Sunday before her AE visit. Can you send 1 mo until she comes for her AE appt.

## 2015-02-01 NOTE — Telephone Encounter (Signed)
Pt aware per vm ocp erx.

## 2015-02-16 ENCOUNTER — Telehealth: Payer: Self-pay | Admitting: Obstetrics and Gynecology

## 2015-02-16 NOTE — Telephone Encounter (Signed)
PT CALLED AND SHE HAS AN APPT ON 2/21 WITH [facility] FOR HER AE, AND SHE DID CALL A FEW WEEKS AND YOU DID CALL IN HER A RX FOR HER BC FOR 1 MONTH SUPPLY TO LAST HER UNTIL HER APPT BUT SHE STATED THAT SHE WILL NEED START A NEW PACK ON 02/26/15 WHICH IS A Sunday AND THAT IS 2 DAYS BEFORE SHE COMES IN FOR HER AE, SO SHE NEEDS ANOTHER 1 MONTH SUPPLY OF BC CALLED IN FOR HER, AND SHE USES THE [facility].

## 2015-02-16 NOTE — Telephone Encounter (Signed)
Spoke with Kathlee Nations at [facility] and pt has a refill. Sent TB a message to inform pt.

## 2015-02-28 ENCOUNTER — Ambulatory Visit (INDEPENDENT_AMBULATORY_CARE_PROVIDER_SITE_OTHER): Payer: 59 | Admitting: Obstetrics and Gynecology

## 2015-02-28 ENCOUNTER — Encounter: Payer: Self-pay | Admitting: Obstetrics and Gynecology

## 2015-02-28 VITALS — BP 138/79 | HR 101 | Ht 64.0 in | Wt 153.9 lb

## 2015-02-28 DIAGNOSIS — Z01419 Encounter for gynecological examination (general) (routine) without abnormal findings: Secondary | ICD-10-CM

## 2015-02-28 DIAGNOSIS — Z8742 Personal history of other diseases of the female genital tract: Secondary | ICD-10-CM | POA: Insufficient documentation

## 2015-02-28 DIAGNOSIS — E890 Postprocedural hypothyroidism: Secondary | ICD-10-CM | POA: Insufficient documentation

## 2015-02-28 DIAGNOSIS — E079 Disorder of thyroid, unspecified: Secondary | ICD-10-CM | POA: Insufficient documentation

## 2015-02-28 MED ORDER — DROSPIRENONE-ETHINYL ESTRADIOL 3-0.02 MG PO TABS: 1.0000 | ORAL_TABLET | Freq: Every day | ORAL | Status: DC

## 2015-02-28 NOTE — Progress Notes (Signed)
Patient ID: Connie Proctor, female   DOB: Jul 08, 1985, 30 y.o.   MRN: [id number] ANNUAL PREVENTATIVE CARE GYN  ENCOUNTER NOTE  Subjective:       Connie Proctor is a 30 y.o. G62P1001 female here for a routine annual gynecologic exam.  Current complaints: 1.  none   Past year.  Status post partial thyroidectomy; currently on levothyroxine with normal TSH; followed by Dr. Netty Starring. History of endometriosis; no pain in: Cycles are regular on birth control pills Bowel and bladder function are normal. Possibly contemplating conception within the next year. Patient is on prenatal vitamins.  Gynecologic History Patient's last menstrual period was 02/24/2015 (exact date). Contraception: OCP (estrogen/progesterone) Last Pap: 02/25/2014 neg. Results were: normal Last mammogram: n/a. Results were: n/a  Obstetric History OB History  Gravida Para Term Preterm AB SAB TAB Ectopic Multiple Living  1 1 1       1     # Outcome Date GA Lbr Len/2nd Weight Sex Delivery Anes PTL Lv  1 Term    7 lb 9.6 oz (3.447 kg) M Vag-Spont   Y     Complications: Malrotation of intestine      Past Medical History  Diagnosis Date  . GERD (gastroesophageal reflux disease)   . Endometriosis of uterus   . Thyroid nodule 2010  . Complication of anesthesia     pt remembers waking up thrashing  . Vaginal Pap smear, abnormal     lgsil  . Rectal/anal hemorrhage   . Nontoxic uninodular goiter     Past Surgical History  Procedure Laterality Date  . Tonsillectomy    . Diagnostic laparoscopy  2003  . Thyroidectomy N/A 07/20/2014    Procedure: THYROIDECTOMY;  Surgeon: Clyde Canterbury, MD;  Location: [facility] ORS;  Service: ENT;  Laterality: N/A;    Current Outpatient Prescriptions on File Prior to Visit  Medication Sig Dispense Refill  . drospirenone-ethinyl estradiol (YAZ,GIANVI,LORYNA) 3-0.02 MG tablet Take 1 tablet by mouth daily. 1 Package 1  . levothyroxine (SYNTHROID, LEVOTHROID) 125 MCG tablet   12   No  current facility-administered medications on file prior to visit.    Allergies  Allergen Reactions  . Tegaderm Ag Mesh [Silver] Itching and Rash    Social History   Social History  . Marital Status: Married    Spouse Name: N/A  . Number of Children: N/A  . Years of Education: N/A   Occupational History  . Not on file.   Social History Main Topics  . Smoking status: Never Smoker   . Smokeless tobacco: Not on file  . Alcohol Use: 0.0 - 0.6 oz/week    0-1 Glasses of wine per week     Comment: occas  . Drug Use: No  . Sexual Activity: Yes    Birth Control/ Protection: Pill   Other Topics Concern  . Not on file   Social History Narrative    Family History  Problem Relation Age of Onset  . Endometriosis Mother   . Endometriosis Sister   . Heart disease Paternal Grandmother   . Diabetes Paternal Grandfather   . Heart disease Paternal Grandfather   . Cancer Neg Hx     The following portions of the patient's history were reviewed and updated as appropriate: allergies, current medications, past family history, past medical history, past social history, past surgical history and problem list.  Review of Systems ROS Review of Systems - General ROS: negative for - chills, fatigue, fever, hot flashes, night sweats, weight gain  or weight loss Psychological ROS: negative for - anxiety, decreased libido, depression, mood swings, physical abuse or sexual abuse Ophthalmic ROS: negative for - blurry vision, eye pain or loss of vision ENT ROS: negative for - headaches, hearing change, visual changes or vocal changes Allergy and Immunology ROS: negative for - hives, itchy/watery eyes or seasonal allergies Hematological and Lymphatic ROS: negative for - bleeding problems, bruising, swollen lymph nodes or weight loss Endocrine ROS: negative for - galactorrhea, hair pattern changes, hot flashes, malaise/lethargy, mood swings, palpitations, polydipsia/polyuria, skin changes, temperature  intolerance or unexpected weight changes Breast ROS: negative for - new or changing breast lumps or nipple discharge Respiratory ROS: negative for - cough or shortness of breath Cardiovascular ROS: negative for - chest pain, irregular heartbeat, palpitations or shortness of breath Gastrointestinal ROS: no abdominal pain, change in bowel habits, or black or bloody stools Genito-Urinary ROS: no dysuria, trouble voiding, or hematuria Musculoskeletal ROS: negative for - joint pain or joint stiffness Neurological ROS: negative for - bowel and bladder control changes Dermatological ROS: negative for rash and skin lesion changes   Objective:   BP 138/79 mmHg  Pulse 101  Ht 5\' 4"  (1.626 m)  Wt 153 lb 14.4 oz (69.809 kg)  BMI 26.40 kg/m2  LMP 02/24/2015 (Exact Date) CONSTITUTIONAL: Well-developed, well-nourished female in no acute distress.  PSYCHIATRIC: Normal mood and affect. Normal behavior. Normal judgment and thought content. [place]: Alert and oriented to person, place, and time. Normal muscle tone coordination. No cranial nerve deficit noted. HENT:  Normocephalic, atraumatic, External right and left ear normal. Oropharynx is clear and moist EYES: Conjunctivae and EOM are normal. Pupils are equal, round, and reactive to light. No scleral icterus.  NECK: Normal range of motion, supple, no masses.  Normal thyroid.  SKIN: Skin is warm and dry. No rash noted. Not diaphoretic. No erythema. No pallor. CARDIOVASCULAR: Normal heart rate noted, regular rhythm, no murmur. RESPIRATORY: Clear to auscultation bilaterally. Effort and breath sounds normal, no problems with respiration noted. BREASTS: Symmetric in size. No masses, skin changes, nipple drainage, or lymphadenopathy. ABDOMEN: Soft, normal bowel sounds, no distention noted.  No tenderness, rebound or guarding.  BLADDER: Normal PELVIC:  External Genitalia: Normal  BUS: Normal  Vagina: Normal  Cervix: Normal; friable; large eversion; no  cervical motion tenderness  Uterus: Normal; midplane, normal size and shape, mobile, nontender  Adnexa: Normal  RV: External Exam NormaI  MUSCULOSKELETAL: Normal range of motion. No tenderness.  No cyanosis, clubbing, or edema.  2+ distal pulses. LYMPHATIC: No Axillary, Supraclavicular, or Inguinal Adenopathy.    Assessment:   Annual gynecologic examination 30 y.o. Contraception: OCP (estrogen/progesterone) bmi-26 Endometriosis-stable Hypothyroidism, status post partial thyroidectomy, euthyroid on levothyroxine  Plan:  Pap: Pap Co Test Mammogram: Not Indicated Stool Guaiac Testing:  Not Indicated Labs: thru pcp Routine preventative health maintenance measures emphasized: Exercise/Diet/Weight control, Tobacco Warnings and Alcohol/Substance use risks Return to [place], CMA  Brayton Mars, MD  Note: This dictation was prepared with Dragon dictation along with smaller phrase technology. Any transcriptional errors that result from this process are unintentional.

## 2015-02-28 NOTE — Patient Instructions (Signed)
1. Pap smear is done. 2. Continue self breast exams 3. Prenatal vitamins daily 4. Return in 1 year for annual exam 5. OCP refill.

## 2015-03-04 LAB — PAP IG AND HPV HIGH-RISK
HPV, high-risk: NEGATIVE
PAP Smear Comment: 0

## 2015-03-10 DIAGNOSIS — Z7189 Other specified counseling: Secondary | ICD-10-CM | POA: Insufficient documentation

## 2015-03-10 DIAGNOSIS — Z0000 Encounter for general adult medical examination without abnormal findings: Secondary | ICD-10-CM | POA: Diagnosis not present

## 2015-03-10 DIAGNOSIS — E079 Disorder of thyroid, unspecified: Secondary | ICD-10-CM | POA: Diagnosis not present

## 2015-03-10 DIAGNOSIS — Z7185 Encounter for immunization safety counseling: Secondary | ICD-10-CM | POA: Insufficient documentation

## 2015-03-21 ENCOUNTER — Encounter: Payer: Self-pay | Admitting: Physician Assistant

## 2015-03-21 ENCOUNTER — Ambulatory Visit: Payer: Self-pay | Admitting: Physician Assistant

## 2015-03-21 VITALS — BP 108/80 | HR 64 | Temp 98.3°F

## 2015-03-21 DIAGNOSIS — J069 Acute upper respiratory infection, unspecified: Secondary | ICD-10-CM

## 2015-03-21 LAB — POCT INFLUENZA A/B
Influenza A, POC: NEGATIVE
Influenza B, POC: NEGATIVE

## 2015-03-21 MED ORDER — AMOXICILLIN 875 MG PO TABS: 875.0000 mg | ORAL_TABLET | Freq: Two times a day (BID) | ORAL | Status: DC

## 2015-03-21 MED ORDER — FLUCONAZOLE 150 MG PO TABS: 150.0000 mg | ORAL_TABLET | Freq: Once | ORAL | Status: DC

## 2015-03-21 NOTE — Progress Notes (Signed)
S: C/o runny nose and congestion for 3 days, no fever, +chills, denies cp/sob, v/d; mucus is green and thick in the mornings,  cough is sporadic, c/o of facial and dental pain, some left ear pain  Using otc meds: ibuprofen and tylenol, sudafed  O: PE: perrl eomi, normocephalic, tms dull, nasal mucosa red and swollen, throat injected, neck supple no lymph, lungs c t a, cv rrr, neuro intact, flu swab neg  A:  Acute sinusitis   P: amoxil, diflucan, drink fluids, continue regular meds , use otc meds of choice, return if not improving in 5 days, return earlier if worsening

## 2015-05-23 ENCOUNTER — Ambulatory Visit: Payer: Self-pay | Admitting: Family

## 2015-05-23 ENCOUNTER — Encounter: Payer: Self-pay | Admitting: Physician Assistant

## 2015-05-23 VITALS — BP 100/80 | HR 80 | Temp 98.5°F

## 2015-05-23 DIAGNOSIS — J00 Acute nasopharyngitis [common cold]: Secondary | ICD-10-CM

## 2015-05-23 DIAGNOSIS — R05 Cough: Secondary | ICD-10-CM

## 2015-05-23 DIAGNOSIS — R059 Cough, unspecified: Secondary | ICD-10-CM

## 2015-05-23 MED ORDER — AMOXICILLIN 875 MG PO TABS: 875.0000 mg | ORAL_TABLET | Freq: Two times a day (BID) | ORAL | Status: DC

## 2015-05-23 MED ORDER — HYDROCOD POLST-CPM POLST ER 10-8 MG/5ML PO SUER: 5.0000 mL | Freq: Two times a day (BID) | ORAL | Status: DC | PRN

## 2015-05-23 NOTE — Progress Notes (Signed)
S /  2 week hx of chest congestion, worse at night with cough , difficulty sleeping, denies fever, SOB, wheezing, or allergies, not responding to otcs , + hx of reflux / zantac  O/ VSS alert pleasant NAD  ENT nasal mucosa mildly inflamed and swollen , light yellow rhinorhea.throat  + PND,  neck supple without nodes,heart rsr lungs clear  A / rhinitis ,cough  P Supportive measures discussed. Follow up prn not improving    Nasal saline products bid and prn rx amoxicillan, tussionex 1 tsp q 12 h prn 100 cc o rf. Delsym , losenges daytime. F/u prn not improving. BUM x one month  (antbx )

## 2015-08-02 ENCOUNTER — Encounter: Payer: Self-pay | Admitting: Physician Assistant

## 2015-08-02 ENCOUNTER — Ambulatory Visit: Payer: Self-pay | Admitting: Physician Assistant

## 2015-08-02 VITALS — BP 118/82 | HR 80 | Temp 98.3°F

## 2015-08-02 DIAGNOSIS — M779 Enthesopathy, unspecified: Secondary | ICD-10-CM

## 2015-08-02 MED ORDER — MELOXICAM 15 MG PO TABS: 15.0000 mg | ORAL_TABLET | Freq: Every day | ORAL | 5 refills | Status: DC

## 2015-08-02 NOTE — Progress Notes (Signed)
S: c/o r wrist pain, increased pain with movement, was worse yesterday and had shooting pain into arm, sx started suddenly on Sunday, no known injury, ?if from overuse or repetitive motion, using a brace, taking nsaids, using ice  O: vitals wnl, nad, skin intact, no redness or swelling, no bruising, wrist and hand neg for bony tenderness, pain reproduced with movement of wrist and thumb, n/v intact  A: tendonitis  P: thumb spica wrist splint applied, mobic 15mg  qd, ice, will refer to ortho if not improving in 1-2 weeks

## 2015-08-22 ENCOUNTER — Ambulatory Visit (INDEPENDENT_AMBULATORY_CARE_PROVIDER_SITE_OTHER): Payer: 59 | Admitting: Obstetrics and Gynecology

## 2015-08-22 ENCOUNTER — Encounter: Payer: Self-pay | Admitting: Obstetrics and Gynecology

## 2015-08-22 VITALS — BP 114/81 | HR 91 | Ht 64.0 in | Wt 159.3 lb

## 2015-08-22 DIAGNOSIS — N760 Acute vaginitis: Secondary | ICD-10-CM

## 2015-08-22 DIAGNOSIS — N898 Other specified noninflammatory disorders of vagina: Secondary | ICD-10-CM

## 2015-08-22 NOTE — Patient Instructions (Signed)
1. Wet prep today-normal 2. Nu swab is sent 3. Results will be made available this week

## 2015-08-22 NOTE — Progress Notes (Signed)
Chief complaint: 1. Vaginitis  Connie Proctor presents today for evaluation of vaginal discharge and itching. She self treated with Diflucan first week of August (expired medication by 1 month) she also did 3 days of Monistat intravaginal therapy. Following treatment, her menses started and over the past several days following cessation of menses she began having vulvar itching symptoms. No recent antibiotic use.  Past medical history, past surgical history, problem list, medications, and allergies are reviewed  OBJECTIVE: BP 114/81   Pulse 91   Ht 5\' 4"  (1.626 m)   Wt 159 lb 4.8 oz (72.3 kg)   LMP 08/12/2015 (Exact Date)   BMI 27.34 kg/m  Pleasant female in no acute distress Pelvic: External genitalia-normal BUS-normal Vagina-mucoid white secretions in vaginal vault Cervix-no lesions Bimanual-not done  PROCEDURE: Wet prep KOH-no hyphae Normal saline-lactobacilli noted; no significant white blood cells; no clue cells; no Trichomonas  ASSESSMENT: 1. Vaginitis symptoms with unremarkable wet prep following recent treatment for Monilia vaginitis  PLAN: 1. Wet prep (as noted) 2. Nu swab 3. Patient will be notified by phone of results and further management planning.  Brayton Mars, MD  Note: This dictation was prepared with Dragon dictation along with smaller phrase technology. Any transcriptional errors that result from this process are unintentional.

## 2015-08-25 LAB — NUSWAB BV AND CANDIDA, NAA
Candida albicans, NAA: NEGATIVE
Candida glabrata, NAA: NEGATIVE

## 2015-09-08 DIAGNOSIS — E079 Disorder of thyroid, unspecified: Secondary | ICD-10-CM | POA: Diagnosis not present

## 2015-09-13 DIAGNOSIS — M25531 Pain in right wrist: Secondary | ICD-10-CM | POA: Diagnosis not present

## 2015-09-13 DIAGNOSIS — M654 Radial styloid tenosynovitis [de Quervain]: Secondary | ICD-10-CM | POA: Diagnosis not present

## 2015-09-15 DIAGNOSIS — E079 Disorder of thyroid, unspecified: Secondary | ICD-10-CM | POA: Diagnosis not present

## 2015-09-15 DIAGNOSIS — K219 Gastro-esophageal reflux disease without esophagitis: Secondary | ICD-10-CM | POA: Diagnosis not present

## 2015-10-20 DIAGNOSIS — H52223 Regular astigmatism, bilateral: Secondary | ICD-10-CM | POA: Diagnosis not present

## 2015-11-04 ENCOUNTER — Telehealth: Payer: 59 | Admitting: Nurse Practitioner

## 2015-11-04 DIAGNOSIS — J0111 Acute recurrent frontal sinusitis: Secondary | ICD-10-CM

## 2015-11-04 MED ORDER — AMOXICILLIN-POT CLAVULANATE 875-125 MG PO TABS: 1.0000 | ORAL_TABLET | Freq: Two times a day (BID) | ORAL | 0 refills | Status: DC

## 2015-11-04 NOTE — Progress Notes (Signed)

## 2016-01-25 ENCOUNTER — Telehealth: Payer: 59 | Admitting: Family

## 2016-01-25 DIAGNOSIS — J111 Influenza due to unidentified influenza virus with other respiratory manifestations: Secondary | ICD-10-CM | POA: Diagnosis not present

## 2016-01-25 MED ORDER — OSELTAMIVIR PHOSPHATE 75 MG PO CAPS: 75.0000 mg | ORAL_CAPSULE | Freq: Two times a day (BID) | ORAL | 0 refills | Status: DC

## 2016-01-25 NOTE — Progress Notes (Signed)

## 2016-03-04 NOTE — Progress Notes (Signed)
Patient ID: Connie Proctor, female   DOB: 03-Apr-1985, 31 y.o.   MRN: [id number] ANNUAL PREVENTATIVE CARE GYN  ENCOUNTER NOTE  Subjective:       Connie Proctor is a 31 y.o. G14P1001 female here for a routine annual gynecologic exam.  Current complaints: 1.  none  2. History of endometriosis  Bowel and bladder function are normal. No pelvic pain. Status post partial thyroidectomy; currently on levothyroxine with normal TSH; followed by Dr. Netty Starring. History of endometriosis; no pain in: Cycles are regular on birth control pills Bowel and bladder function are normal. Possibly contemplating conception within the next year. Patient is on prenatal vitamins.  Gynecologic History No LMP recorded. Contraception: OCP (estrogen/progesterone) Last Pap: 2/21/2017neg/neg. Results were: normal Last mammogram: n/a. Results were: n/a  Obstetric History OB History  Gravida Para Term Preterm AB Living  1 1 1     1   SAB TAB Ectopic Multiple Live Births          1    # Outcome Date GA Lbr Len/2nd Weight Sex Delivery Anes PTL Lv  1 Term    7 lb 9.6 oz (3.447 kg) M Vag-Spont   LIV     Complications: Malrotation of intestine      Past Medical History:  Diagnosis Date  . Complication of anesthesia    pt remembers waking up thrashing  . Endometriosis of uterus   . GERD (gastroesophageal reflux disease)   . Nontoxic uninodular goiter   . Rectal/anal hemorrhage   . Tendonitis   . Thyroid nodule 2010  . Vaginal Pap smear, abnormal    lgsil    Past Surgical History:  Procedure Laterality Date  . DIAGNOSTIC LAPAROSCOPY  2003  . THYROIDECTOMY N/A 07/20/2014   Procedure: THYROIDECTOMY;  Surgeon: Clyde Canterbury, MD;  Location: [facility] ORS;  Service: ENT;  Laterality: N/A;  . TONSILLECTOMY      Current Outpatient Prescriptions on File Prior to Visit  Medication Sig Dispense Refill  . amoxicillin-clavulanate (AUGMENTIN) 875-125 MG tablet Take 1 tablet by mouth 2 (two) times daily. 20 tablet 0   . drospirenone-ethinyl estradiol (YAZ,GIANVI,LORYNA) 3-0.02 MG tablet Take 1 tablet by mouth daily. 3 Package 4  . levothyroxine (SYNTHROID, LEVOTHROID) 125 MCG tablet   12  . oseltamivir (TAMIFLU) 75 MG capsule Take 1 capsule (75 mg total) by mouth 2 (two) times daily. 10 capsule 0  . Prenatal MV-Min-Fe Fum-FA-DHA (PRENATAL 1 PO) Take by mouth.    . ranitidine (ZANTAC) 150 MG tablet Take by mouth.     No current facility-administered medications on file prior to visit.     Allergies  Allergen Reactions  . Tegaderm Ag Mesh [Silver] Itching and Rash    Social History   Social History  . Marital status: Married    Spouse name: N/A  . Number of children: N/A  . Years of education: N/A   Occupational History  . Not on file.   Social History Main Topics  . Smoking status: Never Smoker  . Smokeless tobacco: Not on file  . Alcohol use 0.0 - 0.6 oz/week     Comment: occas  . Drug use: No  . Sexual activity: Yes    Birth control/ protection: Pill   Other Topics Concern  . Not on file   Social History Narrative  . No narrative on file    Family History  Problem Relation Age of Onset  . Endometriosis Mother   . Endometriosis Sister   . Heart disease  Paternal Grandmother   . Diabetes Paternal Grandfather   . Heart disease Paternal Grandfather   . Cancer Neg Hx     The following portions of the patient's history were reviewed and updated as appropriate: allergies, current medications, past family history, past medical history, past social history, past surgical history and problem list.  Review of Systems Review of Systems  Constitutional: Negative.   Respiratory: Negative.   Cardiovascular: Negative.   Gastrointestinal: Negative.        Bowel function normal  Genitourinary: Negative.        No pelvic pain Bladder function normal  Musculoskeletal: Negative.   Skin: Negative.   Neurological: Negative.   Endo/Heme/Allergies: Negative.   Psychiatric/Behavioral:  Negative.       Objective:   BP 127/89   Pulse 86   Ht 5\' 4"  (1.626 m)   Wt 156 lb 12.8 oz (71.1 kg)   LMP 02/23/2016 (Exact Date)   BMI 26.91 kg/m  CONSTITUTIONAL: Well-developed, well-nourished female in no acute distress.  PSYCHIATRIC: Normal mood and affect. Normal behavior. Normal judgment and thought content. [place]: Alert and oriented to person, place, and time. Normal muscle tone coordination. No cranial nerve deficit noted. HENT:  Normocephalic, atraumatic, External right and left ear normal.  EYES: Conjunctivae and EOM are normal.No scleral icterus.  NECK: Normal range of motion, supple, no masses.  Normal thyroid.  SKIN: Skin is warm and dry. No rash noted. Not diaphoretic. No erythema. No pallor. CARDIOVASCULAR: Normal heart rate noted, regular rhythm, no murmur. RESPIRATORY: Clear to auscultation bilaterally. Effort and breath sounds normal, no problems with respiration noted. BREASTS: Symmetric in size. No masses, skin changes, nipple drainage, or lymphadenopathy. ABDOMEN: Soft, normal bowel sounds, no distention noted.  No tenderness, rebound or guarding.  BLADDER: Normal PELVIC:  External Genitalia: Normal  BUS: Normal  Vagina: Normal  Cervix: Normal; large eversion; no cervical motion tenderness  Uterus: Normal; midplane, normal size and shape, mobile, nontender  Adnexa: Normal  RV: External Exam NormaI  MUSCULOSKELETAL: Normal range of motion. No tenderness.  No cyanosis, clubbing, or edema.  2+ distal pulses. LYMPHATIC: No Axillary, Supraclavicular, or Inguinal Adenopathy.    Assessment:   Annual gynecologic examination 31 y.o. Contraception: OCP (estrogen/progesterone) bmi-26 Endometriosis-stable Hypothyroidism, status post partial thyroidectomy, euthyroid on levothyroxine  Plan:  Pap: due 2020 Mammogram: Not Indicated Stool Guaiac Testing:  Not Indicated Labs: thru pcp Routine preventative health maintenance measures emphasized:  Exercise/Diet/Weight control, Tobacco Warnings and Alcohol/Substance use risks  Refill birth control pills Continue with prenatal vitamins daily Wait for 1 normal cycle after discontinuing birth control pills when attempting conception Return to [place], CMA  Brayton Mars, MD   Note: This dictation was prepared with Dragon dictation along with smaller phrase technology. Any transcriptional errors that result from this process are unintentional.

## 2016-03-05 ENCOUNTER — Ambulatory Visit (INDEPENDENT_AMBULATORY_CARE_PROVIDER_SITE_OTHER): Payer: 59 | Admitting: Obstetrics and Gynecology

## 2016-03-05 ENCOUNTER — Encounter: Payer: Self-pay | Admitting: Obstetrics and Gynecology

## 2016-03-05 VITALS — BP 127/89 | HR 86 | Ht 64.0 in | Wt 156.8 lb

## 2016-03-05 DIAGNOSIS — Z01419 Encounter for gynecological examination (general) (routine) without abnormal findings: Secondary | ICD-10-CM | POA: Diagnosis not present

## 2016-03-05 DIAGNOSIS — Z8742 Personal history of other diseases of the female genital tract: Secondary | ICD-10-CM | POA: Diagnosis not present

## 2016-03-05 DIAGNOSIS — Z3041 Encounter for surveillance of contraceptive pills: Secondary | ICD-10-CM

## 2016-03-05 MED ORDER — DROSPIRENONE-ETHINYL ESTRADIOL 3-0.02 MG PO TABS: 1.0000 | ORAL_TABLET | Freq: Every day | ORAL | 4 refills | Status: DC

## 2016-03-05 NOTE — Patient Instructions (Signed)
1. No Pap smear needed 2. Continue with self breast awareness 3. Continue with healthy eating and exercise 4. Continue with prenatal vitamins daily 5. Birth control pills refilled 6. When desiring conception, stopped birth control pills and wait for 1 normal cycle prior to attempting conception 7. Return in 1 year for physical  Health Maintenance, Female Adopting a healthy lifestyle and getting preventive care can go a long way to promote health and wellness. Talk with your health care provider about what schedule of regular examinations is right for you. This is a good chance for you to check in with your provider about disease prevention and staying healthy. In between checkups, there are plenty of things you can do on your own. Experts have done a lot of research about which lifestyle changes and preventive measures are most likely to keep you healthy. Ask your health care provider for more information. Weight and diet Eat a healthy diet  Be sure to include plenty of vegetables, fruits, low-fat dairy products, and lean protein.  Do not eat a lot of foods high in solid fats, added sugars, or salt.  Get regular exercise. This is one of the most important things you can do for your health.  Most adults should exercise for at least 150 minutes each week. The exercise should increase your heart rate and make you sweat (moderate-intensity exercise).  Most adults should also do strengthening exercises at least twice a week. This is in addition to the moderate-intensity exercise. Maintain a healthy weight  Body mass index (BMI) is a measurement that can be used to identify possible weight problems. It estimates body fat based on height and weight. Your health care provider can help determine your BMI and help you achieve or maintain a healthy weight.  For females 67 years of age and older:  A BMI below 18.5 is considered underweight.  A BMI of 18.5 to 24.9 is normal.  A BMI of 25 to 29.9  is considered overweight.  A BMI of 30 and above is considered obese. Watch levels of cholesterol and blood lipids  You should start having your blood tested for lipids and cholesterol at 32 years of age, then have this test every 5 years.  You may need to have your cholesterol levels checked more often if:  Your lipid or cholesterol levels are high.  You are older than 32 years of age.  You are at high risk for heart disease. Cancer screening Lung Cancer  Lung cancer screening is recommended for adults 15-38 years old who are at high risk for lung cancer because of a history of smoking.  A yearly low-dose CT scan of the lungs is recommended for people who:  Currently smoke.  Have quit within the past 15 years.  Have at least a 30-pack-year history of smoking. A pack year is smoking an average of one pack of cigarettes a day for 1 year.  Yearly screening should continue until it has been 15 years since you quit.  Yearly screening should stop if you develop a health problem that would prevent you from having lung cancer treatment. Breast Cancer  Practice breast self-awareness. This means understanding how your breasts normally appear and feel.  It also means doing regular breast self-exams. Let your health care provider know about any changes, no matter how small.  If you are in your 20s or 30s, you should have a clinical breast exam (CBE) by a health care provider every 1-3 years as part of  a regular health exam.  If you are 65 or older, have a CBE every year. Also consider having a breast X-ray (mammogram) every year.  If you have a family history of breast cancer, talk to your health care provider about genetic screening.  If you are at high risk for breast cancer, talk to your health care provider about having an MRI and a mammogram every year.  Breast cancer gene (BRCA) assessment is recommended for women who have family members with BRCA-related cancers. BRCA-related  cancers include:  Breast.  Ovarian.  Tubal.  Peritoneal cancers.  Results of the assessment will determine the need for genetic counseling and BRCA1 and BRCA2 testing. Cervical Cancer  Your health care provider may recommend that you be screened regularly for cancer of the pelvic organs (ovaries, uterus, and vagina). This screening involves a pelvic examination, including checking for microscopic changes to the surface of your cervix (Pap test). You may be encouraged to have this screening done every 3 years, beginning at age 28.  For women ages 16-65, health care providers may recommend pelvic exams and Pap testing every 3 years, or they may recommend the Pap and pelvic exam, combined with testing for human papilloma virus (HPV), every 5 years. Some types of HPV increase your risk of cervical cancer. Testing for HPV may also be done on women of any age with unclear Pap test results.  Other health care providers may not recommend any screening for nonpregnant women who are considered low risk for pelvic cancer and who do not have symptoms. Ask your health care provider if a screening pelvic exam is right for you.  If you have had past treatment for cervical cancer or a condition that could lead to cancer, you need Pap tests and screening for cancer for at least 20 years after your treatment. If Pap tests have been discontinued, your risk factors (such as having a new sexual partner) need to be reassessed to determine if screening should resume. Some women have medical problems that increase the chance of getting cervical cancer. In these cases, your health care provider may recommend more frequent screening and Pap tests. Colorectal Cancer  This type of cancer can be detected and often prevented.  Routine colorectal cancer screening usually begins at 32 years of age and continues through 32 years of age.  Your health care provider may recommend screening at an earlier age if you have risk  factors for colon cancer.  Your health care provider may also recommend using home test kits to check for hidden blood in the stool.  A small camera at the end of a tube can be used to examine your colon directly (sigmoidoscopy or colonoscopy). This is done to check for the earliest forms of colorectal cancer.  Routine screening usually begins at age 9.  Direct examination of the colon should be repeated every 5-10 years through 32 years of age. However, you may need to be screened more often if early forms of precancerous polyps or small growths are found. Skin Cancer  Check your skin from head to toe regularly.  Tell your health care provider about any new moles or changes in moles, especially if there is a change in a mole's shape or color.  Also tell your health care provider if you have a mole that is larger than the size of a pencil eraser.  Always use sunscreen. Apply sunscreen liberally and repeatedly throughout the day.  Protect yourself by wearing long sleeves, pants, a  wide-brimmed hat, and sunglasses whenever you are outside. Heart disease, diabetes, and high blood pressure  High blood pressure causes heart disease and increases the risk of stroke. High blood pressure is more likely to develop in:  People who have blood pressure in the high end of the normal range (130-139/85-89 mm Hg).  People who are overweight or obese.  People who are African American.  If you are 3-47 years of age, have your blood pressure checked every 3-5 years. If you are 81 years of age or older, have your blood pressure checked every year. You should have your blood pressure measured twice-once when you are at a hospital or clinic, and once when you are not at a hospital or clinic. Record the average of the two measurements. To check your blood pressure when you are not at a hospital or clinic, you can use:  An automated blood pressure machine at a pharmacy.  A home blood pressure  monitor.  If you are between 34 years and 81 years old, ask your health care provider if you should take aspirin to prevent strokes.  Have regular diabetes screenings. This involves taking a blood sample to check your fasting blood sugar level.  If you are at a normal weight and have a low risk for diabetes, have this test once every three years after 32 years of age.  If you are overweight and have a high risk for diabetes, consider being tested at a younger age or more often. Preventing infection Hepatitis B  If you have a higher risk for hepatitis B, you should be screened for this virus. You are considered at high risk for hepatitis B if:  You were born in a country where hepatitis B is common. Ask your health care provider which countries are considered high risk.  Your parents were born in a high-risk country, and you have not been immunized against hepatitis B (hepatitis B vaccine).  You have HIV or AIDS.  You use needles to inject street drugs.  You live with someone who has hepatitis B.  You have had sex with someone who has hepatitis B.  You get hemodialysis treatment.  You take certain medicines for conditions, including cancer, organ transplantation, and autoimmune conditions. Hepatitis C  Blood testing is recommended for:  Everyone born from [birth year] through 1966.  Anyone with known risk factors for hepatitis C. Sexually transmitted infections (STIs)  You should be screened for sexually transmitted infections (STIs) including gonorrhea and chlamydia if:  You are sexually active and are younger than 32 years of age.  You are older than 32 years of age and your health care provider tells you that you are at risk for this type of infection.  Your sexual activity has changed since you were last screened and you are at an increased risk for chlamydia or gonorrhea. Ask your health care provider if you are at risk.  If you do not have HIV, but are at risk, it may be  recommended that you take a prescription medicine daily to prevent HIV infection. This is called pre-exposure prophylaxis (PrEP). You are considered at risk if:  You are sexually active and do not regularly use condoms or know the HIV status of your partner(s).  You take drugs by injection.  You are sexually active with a partner who has HIV. Talk with your health care provider about whether you are at high risk of being infected with HIV. If you choose to begin PrEP, you should  first be tested for HIV. You should then be tested every 3 months for as long as you are taking PrEP. Pregnancy  If you are premenopausal and you may become pregnant, ask your health care provider about preconception counseling.  If you may become pregnant, take 400 to 800 micrograms (mcg) of folic acid every day.  If you want to prevent pregnancy, talk to your health care provider about birth control (contraception). Osteoporosis and menopause  Osteoporosis is a disease in which the bones lose minerals and strength with aging. This can result in serious bone fractures. Your risk for osteoporosis can be identified using a bone density scan.  If you are 64 years of age or older, or if you are at risk for osteoporosis and fractures, ask your health care provider if you should be screened.  Ask your health care provider whether you should take a calcium or vitamin D supplement to lower your risk for osteoporosis.  Menopause may have certain physical symptoms and risks.  Hormone replacement therapy may reduce some of these symptoms and risks. Talk to your health care provider about whether hormone replacement therapy is right for you. Follow these instructions at home:  Schedule regular health, dental, and eye exams.  Stay current with your immunizations.  Do not use any tobacco products including cigarettes, chewing tobacco, or electronic cigarettes.  If you are pregnant, do not drink alcohol.  If you are  breastfeeding, limit how much and how often you drink alcohol.  Limit alcohol intake to no more than 1 drink per day for nonpregnant women. One drink equals 12 ounces of beer, 5 ounces of wine, or 1 ounces of hard liquor.  Do not use street drugs.  Do not share needles.  Ask your health care provider for help if you need support or information about quitting drugs.  Tell your health care provider if you often feel depressed.  Tell your health care provider if you have ever been abused or do not feel safe at home. This information is not intended to replace advice given to you by your health care provider. Make sure you discuss any questions you have with your health care provider. Document Released: 07/09/2010 Document Revised: 06/01/2015 Document Reviewed: 09/27/2014 Elsevier Interactive Patient Education  2017 [employer].

## 2016-03-07 DIAGNOSIS — Z0000 Encounter for general adult medical examination without abnormal findings: Secondary | ICD-10-CM | POA: Diagnosis not present

## 2016-03-15 ENCOUNTER — Other Ambulatory Visit
Admission: RE | Admit: 2016-03-15 | Discharge: 2016-03-15 | Disposition: A | Discharge status: Home / Self Care | Payer: 59 | Source: Ambulatory Visit | Attending: Family Medicine | Admitting: Family Medicine

## 2016-03-15 ENCOUNTER — Other Ambulatory Visit: Payer: Self-pay | Admitting: Family Medicine

## 2016-03-15 DIAGNOSIS — E079 Disorder of thyroid, unspecified: Secondary | ICD-10-CM | POA: Diagnosis not present

## 2016-03-15 DIAGNOSIS — R0789 Other chest pain: Secondary | ICD-10-CM | POA: Diagnosis not present

## 2016-03-15 DIAGNOSIS — R079 Chest pain, unspecified: Secondary | ICD-10-CM

## 2016-03-15 DIAGNOSIS — R7989 Other specified abnormal findings of blood chemistry: Secondary | ICD-10-CM

## 2016-03-15 DIAGNOSIS — R899 Unspecified abnormal finding in specimens from other organs, systems and tissues: Secondary | ICD-10-CM | POA: Diagnosis not present

## 2016-03-15 DIAGNOSIS — Z0000 Encounter for general adult medical examination without abnormal findings: Secondary | ICD-10-CM | POA: Diagnosis not present

## 2016-03-15 LAB — FIBRIN DERIVATIVES D-DIMER (ARMC ONLY): Fibrin derivatives D-dimer (ARMC): 589.45 — ABNORMAL HIGH (ref 0.00–499.00)

## 2016-03-15 MED ORDER — IOPAMIDOL (ISOVUE-370) INJECTION 76%
75.0000 mL | Freq: Once | INTRAVENOUS | Status: AC | PRN
  Administered 2016-03-15: 75 mL via INTRAVENOUS

## 2016-03-29 DIAGNOSIS — M654 Radial styloid tenosynovitis [de Quervain]: Secondary | ICD-10-CM | POA: Diagnosis not present

## 2016-05-16 DIAGNOSIS — E079 Disorder of thyroid, unspecified: Secondary | ICD-10-CM | POA: Diagnosis not present

## 2016-06-07 DIAGNOSIS — F418 Other specified anxiety disorders: Secondary | ICD-10-CM | POA: Diagnosis not present

## 2016-07-15 DIAGNOSIS — F418 Other specified anxiety disorders: Secondary | ICD-10-CM | POA: Diagnosis not present

## 2016-08-23 ENCOUNTER — Telehealth: Payer: 59 | Admitting: Family

## 2016-08-23 DIAGNOSIS — J028 Acute pharyngitis due to other specified organisms: Secondary | ICD-10-CM | POA: Diagnosis not present

## 2016-08-23 DIAGNOSIS — B9689 Other specified bacterial agents as the cause of diseases classified elsewhere: Secondary | ICD-10-CM

## 2016-08-23 MED ORDER — BENZONATATE 100 MG PO CAPS: 100.0000 mg | ORAL_CAPSULE | Freq: Three times a day (TID) | ORAL | 0 refills | Status: DC | PRN

## 2016-08-23 MED ORDER — AZITHROMYCIN 250 MG PO TABS: ORAL_TABLET | ORAL | 0 refills | Status: DC

## 2016-08-23 NOTE — Progress Notes (Signed)
Thank you for the details you put in the comment boxes. Those details really help us take better care of you.   We are sorry that you are not feeling well.  Here is how we plan to help!  Based on your presentation I believe you most likely have A cough due to bacteria.  When patients have a fever and a productive cough with a change in color or increased sputum production, we are concerned about bacterial bronchitis.  If left untreated it can progress to pneumonia.  If your symptoms do not improve with your treatment plan it is important that you contact your provider.   I have prescribed Azithromyin 250 mg: two tables now and then one tablet daily for 4 additonal days    In addition you may use A non-prescription cough medication called Mucinex DM: take 2 tablets every 12 hours. and A prescription cough medication called Tessalon Perles 100mg. You may take 1-2 capsules every 8 hours as needed for your cough.   From your responses in the eVisit questionnaire you describe inflammation in the upper respiratory tract which is causing a significant cough.  This is commonly called Bronchitis and has four common causes:    Allergies  Viral Infections  Acid Reflux  Bacterial Infection Allergies, viruses and acid reflux are treated by controlling symptoms or eliminating the cause. An example might be a cough caused by taking certain blood pressure medications. You stop the cough by changing the medication. Another example might be a cough caused by acid reflux. Controlling the reflux helps control the cough.  USE OF BRONCHODILATOR ("RESCUE") INHALERS: There is a risk from using your bronchodilator too frequently.  The risk is that over-reliance on a medication which only relaxes the muscles surrounding the breathing tubes can reduce the effectiveness of medications prescribed to reduce swelling and congestion of the tubes themselves.  Although you feel brief relief from the bronchodilator inhaler, your  asthma may actually be worsening with the tubes becoming more swollen and filled with mucus.  This can delay other crucial treatments, such as oral steroid medications. If you need to use a bronchodilator inhaler daily, several times per day, you should discuss this with your provider.  There are probably better treatments that could be used to keep your asthma under control.     HOME CARE . Only take medications as instructed by your medical team. . Complete the entire course of an antibiotic. . Drink plenty of fluids and get plenty of rest. . Avoid close contacts especially the very young and the elderly . Cover your mouth if you cough or cough into your sleeve. . Always remember to wash your hands . A steam or ultrasonic humidifier can help congestion.   GET HELP RIGHT AWAY IF: . You develop worsening fever. . You become short of breath . You cough up blood. . Your symptoms persist after you have completed your treatment plan MAKE SURE YOU   Understand these instructions.  Will watch your condition.  Will get help right away if you are not doing well or get worse.  Your e-visit answers were reviewed by a board certified advanced clinical practitioner to complete your personal care plan.  Depending on the condition, your plan could have included both over the counter or prescription medications. If there is a problem please reply  once you have received a response from your provider. Your safety is important to us.  If you have drug allergies check your prescription carefully.      You can use MyChart to ask questions about today's visit, request a non-urgent call back, or ask for a work or school excuse for 24 hours related to this e-Visit. If it has been greater than 24 hours you will need to follow up with your provider, or enter a new e-Visit to address those concerns. You will get an e-mail in the next two days asking about your experience.  I hope that your e-visit has been  valuable and will speed your recovery. Thank you for using e-visits.   

## 2016-09-12 DIAGNOSIS — E079 Disorder of thyroid, unspecified: Secondary | ICD-10-CM | POA: Diagnosis not present

## 2016-10-09 DIAGNOSIS — E079 Disorder of thyroid, unspecified: Secondary | ICD-10-CM | POA: Diagnosis not present

## 2016-10-09 DIAGNOSIS — F411 Generalized anxiety disorder: Secondary | ICD-10-CM | POA: Diagnosis not present

## 2016-11-16 ENCOUNTER — Telehealth: Payer: 59 | Admitting: Nurse Practitioner

## 2016-11-16 DIAGNOSIS — J0100 Acute maxillary sinusitis, unspecified: Secondary | ICD-10-CM

## 2016-11-16 MED ORDER — AMOXICILLIN-POT CLAVULANATE 875-125 MG PO TABS: 1.0000 | ORAL_TABLET | Freq: Two times a day (BID) | ORAL | 0 refills | Status: DC

## 2016-11-16 NOTE — Progress Notes (Signed)

## 2017-01-06 ENCOUNTER — Ambulatory Visit: Payer: Self-pay | Admitting: Family

## 2017-01-06 VITALS — BP 109/60 | HR 116 | Temp 98.2°F | Resp 16

## 2017-01-06 DIAGNOSIS — J019 Acute sinusitis, unspecified: Secondary | ICD-10-CM

## 2017-01-06 MED ORDER — BENZONATATE 200 MG PO CAPS: 200.0000 mg | ORAL_CAPSULE | Freq: Two times a day (BID) | ORAL | 0 refills | Status: DC | PRN

## 2017-01-06 MED ORDER — AMOXICILLIN 875 MG PO TABS: 875.0000 mg | ORAL_TABLET | Freq: Two times a day (BID) | ORAL | 0 refills | Status: DC

## 2017-01-06 NOTE — Progress Notes (Signed)
S/1 week history of nasal congestion, rhinorrhea now blowing yellow, chest  congestion and deep cough with  mucus production; denies fever but is fatigued, taking Tylenol are Advil, mucinex , Sudafed   O/ alert mildly ill-appearing but pleasant and no acute distress pulse rate increased thought secondary to Sudafed ENT: TMs are retracted, nasal mucosa erythematous swollen, frontal sinus tenderness to palpation, pharynx unremarkable neck supple without adenopathy heart RR, slight tachycardia lungs are clear  A/acute sinusitis  P/amoxicillin, Tessalon Perles.Supportive measures discussed. Follow up prn not improving  BUM x one month while on Atbx.

## 2017-02-05 ENCOUNTER — Encounter: Payer: Self-pay | Admitting: Psychiatry

## 2017-02-05 ENCOUNTER — Ambulatory Visit (INDEPENDENT_AMBULATORY_CARE_PROVIDER_SITE_OTHER): Payer: Self-pay | Admitting: Psychiatry

## 2017-02-05 VITALS — BP 122/84 | HR 82 | Temp 98.1°F | Wt 165.0 lb

## 2017-02-05 DIAGNOSIS — H938X3 Other specified disorders of ear, bilateral: Secondary | ICD-10-CM

## 2017-02-05 NOTE — Progress Notes (Signed)
Subjective:     Connie Proctor is a 32 y.o. female who presents with bilateral ear pressure to both ears for the past 3 days and have been unchanged since that time. Pressure is constant, feels like there is fluid inside. Symptoms include postear drainage and clear drainage from the throat, scratchy throat : Patient denies: congestion, fever , headache, productive cough and sinus pressure. She is drinking plenty of fluids. Patient [place]  The following portions of the patient's history were reviewed and updated as appropriate: allergies, current medications, past family history, past medical history, past social history, past surgical history and problem list.  Review of Systems Pertinent items are noted in HPI.   Objective:    BP 122/84   Pulse 82   Temp 98.1 F (36.7 C)   Wt 165 lb (74.8 kg)   SpO2 100%   BMI 28.32 kg/m  General:  alert, cooperative and no distress  Right Ear: normal landmarks and mobility and normal mobility  Left Ear: normal landmarks and mobility and normal mobility  Mouth:  lips, mucosa, and tongue normal; teeth and gums normal  Neck: no adenopathy, no carotid bruit, no JVD, supple, symmetrical, trachea midline and thyroid not enlarged, symmetric, no tenderness/mass/nodules     Assessment:     Raveen was seen today for focus-earache,pressure,clogged ear x 3d  uses sudafed.  Diagnoses and all orders for this visit:  Pressure sensation in ear, bilateral   Plan:    Treatment: No antibiotics indicated at this time. OTC analgesia as needed. pressure most likely from high altitude to the mountains.  Techniques to equalize pressure (to "pop" your ears), such as: ? Yawning. ? Chewing gum.Swallowing..  Follow up in 7 days if not improving.

## 2017-02-06 ENCOUNTER — Telehealth: Payer: No Typology Code available for payment source | Admitting: Family

## 2017-02-06 DIAGNOSIS — J028 Acute pharyngitis due to other specified organisms: Secondary | ICD-10-CM

## 2017-02-06 DIAGNOSIS — B9689 Other specified bacterial agents as the cause of diseases classified elsewhere: Secondary | ICD-10-CM | POA: Diagnosis not present

## 2017-02-06 MED ORDER — AMOXICILLIN-POT CLAVULANATE 875-125 MG PO TABS: 1.0000 | ORAL_TABLET | Freq: Two times a day (BID) | ORAL | 0 refills | Status: AC

## 2017-02-06 MED ORDER — BENZONATATE 100 MG PO CAPS: 100.0000 mg | ORAL_CAPSULE | Freq: Three times a day (TID) | ORAL | 0 refills | Status: DC | PRN

## 2017-02-06 MED ORDER — PREDNISONE 5 MG PO TABS: 5.0000 mg | ORAL_TABLET | ORAL | 0 refills | Status: DC

## 2017-02-06 NOTE — Progress Notes (Signed)
Thank you for the details you included in the comment boxes. Those details are very helpful in determining the best course of treatment for you and help [place] to provide the best care. This will treat the sinus infection, ear pressure/fluid and cough.  We are sorry that you are not feeling well.  Here is how we plan to help!  Based on what you have shared with me it looks like you have sinusitis.  Sinusitis is inflammation and infection in the sinus cavities of the head.  Based on your presentation I believe you most likely have Acute Bacterial Sinusitis.  This is an infection caused by bacteria and is treated with antibiotics. I have prescribed Augmentin 875mg /125mg  one tablet twice daily with food, for 7 days. You may use an oral decongestant such as Mucinex D or if you have glaucoma or high blood pressure use plain Mucinex. Saline nasal spray help and can safely be used as often as needed for congestion.  If you develop worsening sinus pain, fever or notice severe headache and vision changes, or if symptoms are not better after completion of antibiotic, please schedule an appointment with a health care provider.    I have also sent tessalon Perles 100mg , take 1-2 every 8 hours for cough. And a prednisone dose pack for the swelling and fluid to drain.   Sinus infections are not as easily transmitted as other respiratory infection, however we still recommend that you avoid close contact with loved ones, especially the very young and elderly.  Remember to wash your hands thoroughly throughout the day as this is the number one way to prevent the spread of infection!  Home Care:  Only take medications as instructed by your medical team.  Complete the entire course of an antibiotic.  Do not take these medications with alcohol.  A steam or ultrasonic humidifier can help congestion.  You can place a towel over your head and breathe in the steam from hot water coming from a faucet.  Avoid close contacts  especially the very young and the elderly.  Cover your mouth when you cough or sneeze.  Always remember to wash your hands.  Get Help Right Away If:  You develop worsening fever or sinus pain.  You develop a severe head ache or visual changes.  Your symptoms persist after you have completed your treatment plan.  Make sure you  Understand these instructions.  Will watch your condition.  Will get help right away if you are not doing well or get worse.  Your e-visit answers were reviewed by a board certified advanced clinical practitioner to complete your personal care plan.  Depending on the condition, your plan could have included both over the counter or prescription medications.  If there is a problem please reply  once you have received a response from your provider.  Your safety is important to [place].  If you have drug allergies check your prescription carefully.    You can use MyChart to ask questions about today's visit, request a non-urgent call back, or ask for a work or school excuse for 24 hours related to this e-Visit. If it has been greater than 24 hours you will need to follow up with your provider, or enter a new e-Visit to address those concerns.  You will get an e-mail in the next two days asking about your experience.  I hope that your e-visit has been valuable and will speed your recovery. Thank you for using e-visits.

## 2017-03-10 NOTE — Progress Notes (Signed)
Patient ID: Connie Proctor, female   DOB: 11-18-1985, 32 y.o.   MRN: [id number] ANNUAL PREVENTATIVE CARE GYN  ENCOUNTER NOTE  Subjective:       Connie Proctor is a 32 y.o. [id number] female here for a routine annual gynecologic exam.  Current complaints: 1.  none  2. History of endometriosis  Bowel and bladder function are normal. No pelvic pain. Status post partial thyroidectomy; currently on levothyroxine with normal TSH; followed by Dr. Netty Starring. History of endometriosis; no pain in: Cycles are regular on birth control pills Bowel and bladder function are normal.  Gynecologic History No LMP recorded. Contraception: OCP (estrogen/progesterone) Last Pap: 2/21/2017neg/neg. Results were: normal Last mammogram: n/a. Results were: n/a  Obstetric History OB History  Gravida Para Term Preterm AB Living  1 1 1     1   SAB TAB Ectopic Multiple Live Births          1    # Outcome Date GA Lbr Len/2nd Weight Sex Delivery Anes PTL Lv  1 Term    7 lb 9.6 oz (3.447 kg) M Vag-Spont   LIV     Complications: Malrotation of intestine      Past Medical History:  Diagnosis Date  . Complication of anesthesia    pt remembers waking up thrashing  . Endometriosis of uterus   . GERD (gastroesophageal reflux disease)   . Nontoxic uninodular goiter   . Rectal/anal hemorrhage   . Tendonitis   . Thyroid nodule 2010  . Cervical Pap smear, abnormal    lgsil    Past Surgical History:  Procedure Laterality Date  . DIAGNOSTIC LAPAROSCOPY  2003  . THYROIDECTOMY N/A 07/20/2014   Procedure: THYROIDECTOMY;  Surgeon: Clyde Canterbury, MD;  Location: [facility] ORS;  Service: ENT;  Laterality: N/A;  . TONSILLECTOMY      Current Outpatient Medications on File Prior to Visit  Medication Sig Dispense Refill  . amoxicillin (AMOXIL) 875 MG tablet Take 1 tablet (875 mg total) by mouth 2 (two) times daily. (Patient not taking: Reported on 02/05/2017) 20 tablet 0  . Ascorbic Acid (VITAMIN C) 1000 MG tablet Take  1,000 mg by mouth daily.    . benzonatate (TESSALON PERLES) 100 MG capsule Take 1-2 capsules (100-200 mg total) by mouth every 8 (eight) hours as needed for cough. 30 capsule 0  . benzonatate (TESSALON) 200 MG capsule Take 1 capsule (200 mg total) by mouth 2 (two) times daily as needed for cough. (Patient not taking: Reported on 02/05/2017) 20 capsule 0  . drospirenone-ethinyl estradiol (YAZ,GIANVI,LORYNA) 3-0.02 MG tablet Take 1 tablet by mouth daily. 3 Package 4  . levothyroxine (SYNTHROID, LEVOTHROID) 125 MCG tablet   12  . predniSONE (DELTASONE) 5 MG tablet Take 1 tablet (5 mg total) by mouth as directed. sterapred generic 21 tablet 0  . ranitidine (ZANTAC) 150 MG tablet Take by mouth.     No current facility-administered medications on file prior to visit.     Allergies  Allergen Reactions  . Tegaderm Ag Mesh [Silver] Itching and Rash    Social History   Socioeconomic History  . Marital status: Married    Spouse name: Not on file  . Number of children: Not on file  . Years of education: Not on file  . Highest education level: Not on file  Social Needs  . Financial resource strain: Not on file  . Food insecurity - worry: Not on file  . Food insecurity - inability: Not on file  .  Transportation needs - medical: Not on file  . Transportation needs - non-medical: Not on file  Occupational History  . Not on file  Tobacco Use  . Smoking status: Never Smoker  . Smokeless tobacco: Never Used  Substance and Sexual Activity  . Alcohol use: Yes    Alcohol/week: 0.0 - 0.6 oz    Comment: occas  . Drug use: No  . Sexual activity: Yes    Birth control/protection: Pill  Other Topics Concern  . Not on file  Social History Narrative  . Not on file    Family History  Problem Relation Age of Onset  . Endometriosis Mother   . Endometriosis Sister   . Heart disease Paternal Grandmother   . Diabetes Paternal Grandfather   . Heart disease Paternal Grandfather   . Cancer Neg Hx      The following portions of the patient's history were reviewed and updated as appropriate: allergies, current medications, past family history, past medical history, past social history, past surgical history and problem list.  Review of Systems Review of Systems  Constitutional: Negative.   Respiratory: Negative.   Cardiovascular: Negative.   Gastrointestinal: Negative.        Bowel function normal  Genitourinary: Negative.        No pelvic pain Bladder function normal  Musculoskeletal: Negative.   Skin: Negative.   Neurological: Negative.   Endo/Heme/Allergies: Negative.   Psychiatric/Behavioral: Negative.       Objective:   BP 121/82   Pulse 87   Ht 5\' 4"  (1.626 m)   Wt 166 lb 1.6 oz (75.3 kg)   LMP 02/22/2017 (Exact Date)   BMI 28.51 kg/m  CONSTITUTIONAL: Well-developed, well-nourished female in no acute distress.  PSYCHIATRIC: Normal mood and affect. Normal behavior. Normal judgment and thought content. [place]: Alert and oriented to person, place, and time. Normal muscle tone coordination. No cranial nerve deficit noted. HENT:  Normocephalic, atraumatic, External right and left ear normal.  EYES: Conjunctivae and EOM are normal.No scleral icterus.  NECK: Normal range of motion, supple, no masses.  Normal thyroid.  SKIN: Skin is warm and dry. No rash noted. Not diaphoretic. No erythema. No pallor. CARDIOVASCULAR: Normal heart rate noted, regular rhythm, no murmur. RESPIRATORY: Clear to auscultation bilaterally. Effort and breath sounds normal, no problems with respiration noted. BREASTS: Symmetric in size. No masses, skin changes, nipple drainage, or lymphadenopathy. ABDOMEN: Soft, normal bowel sounds, no distention noted.  No tenderness, rebound or guarding.  BLADDER: Normal PELVIC:  External Genitalia: Normal  BUS: Normal  Vagina: Normal  Cervix: Normal; large eversion; no cervical motion tenderness  Uterus: Normal; midplane, normal size and shape, mobile,  nontender  Adnexa: Normal  RV: External Exam NormaI  MUSCULOSKELETAL: Normal range of motion. No tenderness.  No cyanosis, clubbing, or edema.  2+ distal pulses. LYMPHATIC: No Axillary, Supraclavicular, or Inguinal Adenopathy.    Assessment:   Annual gynecologic examination 32 y.o. Contraception: OCP (estrogen/progesterone) bmi-28 Endometriosis-stable Hypothyroidism, status post partial thyroidectomy, euthyroid on levothyroxine  Plan:  Pap: due 2020 Mammogram: Not Indicated Stool Guaiac Testing:  Not Indicated Labs: thru pcp Routine preventative health maintenance measures emphasized: Exercise/Diet/Weight control, Tobacco Warnings and Alcohol/Substance use risks  Refill birth control pills  Return to [place], [place]  Brayton Mars, MD  Note: This dictation was prepared with Dragon dictation along with smaller phrase technology. Any transcriptional errors that result from this process are unintentional.

## 2017-03-12 ENCOUNTER — Encounter: Payer: Self-pay | Admitting: Obstetrics and Gynecology

## 2017-03-12 ENCOUNTER — Ambulatory Visit (INDEPENDENT_AMBULATORY_CARE_PROVIDER_SITE_OTHER): Payer: No Typology Code available for payment source | Admitting: Obstetrics and Gynecology

## 2017-03-12 VITALS — BP 121/82 | HR 87 | Ht 64.0 in | Wt 166.1 lb

## 2017-03-12 DIAGNOSIS — Z8742 Personal history of other diseases of the female genital tract: Secondary | ICD-10-CM

## 2017-03-12 DIAGNOSIS — Z01419 Encounter for gynecological examination (general) (routine) without abnormal findings: Secondary | ICD-10-CM | POA: Diagnosis not present

## 2017-03-12 DIAGNOSIS — Z3041 Encounter for surveillance of contraceptive pills: Secondary | ICD-10-CM

## 2017-03-12 DIAGNOSIS — M545 Low back pain: Secondary | ICD-10-CM

## 2017-03-12 MED ORDER — DROSPIRENONE-ETHINYL ESTRADIOL 3-0.02 MG PO TABS: 1.0000 | ORAL_TABLET | Freq: Every day | ORAL | 4 refills | Status: DC

## 2017-03-12 NOTE — Patient Instructions (Signed)
1.  No Pap smear done today.  Next Pap is due 2020. 2.  Continue self breast awareness 3.  Continue with healthy eating and exercise 4.  Continue with birth control pills for contraception and management of endometriosis 5.  Keep follow-up with Dr. Carlean Jews for monitoring of thyroid disease 6.  Return in 1 year for annual exam  Health Maintenance, Female Adopting a healthy lifestyle and getting preventive care can go a long way to promote health and wellness. Talk with your health care provider about what schedule of regular examinations is right for you. This is a good chance for you to check in with your provider about disease prevention and staying healthy. In between checkups, there are plenty of things you can do on your own. Experts have done a lot of research about which lifestyle changes and preventive measures are most likely to keep you healthy. Ask your health care provider for more information. Weight and diet Eat a healthy diet  Be sure to include plenty of vegetables, fruits, low-fat dairy products, and lean protein.  Do not eat a lot of foods high in solid fats, added sugars, or salt.  Get regular exercise. This is one of the most important things you can do for your health. ? Most adults should exercise for at least 150 minutes each week. The exercise should increase your heart rate and make you sweat (moderate-intensity exercise). ? Most adults should also do strengthening exercises at least twice a week. This is in addition to the moderate-intensity exercise.  Maintain a healthy weight  Body mass index (BMI) is a measurement that can be used to identify possible weight problems. It estimates body fat based on height and weight. Your health care provider can help determine your BMI and help you achieve or maintain a healthy weight.  For females 62 years of age and older: ? A BMI below 18.5 is considered underweight. ? A BMI of 18.5 to 24.9 is normal. ? A BMI of 25 to 29.9 is  considered overweight. ? A BMI of 30 and above is considered obese.  Watch levels of cholesterol and blood lipids  You should start having your blood tested for lipids and cholesterol at 33 years of age, then have this test every 5 years.  You may need to have your cholesterol levels checked more often if: ? Your lipid or cholesterol levels are high. ? You are older than 33 years of age. ? You are at high risk for heart disease.  Cancer screening Lung Cancer  Lung cancer screening is recommended for adults 61-64 years old who are at high risk for lung cancer because of a history of smoking.  A yearly low-dose CT scan of the lungs is recommended for people who: ? Currently smoke. ? Have quit within the past 15 years. ? Have at least a 30-pack-year history of smoking. A pack year is smoking an average of one pack of cigarettes a day for 1 year.  Yearly screening should continue until it has been 15 years since you quit.  Yearly screening should stop if you develop a health problem that would prevent you from having lung cancer treatment.  Breast Cancer  Practice breast self-awareness. This means understanding how your breasts normally appear and feel.  It also means doing regular breast self-exams. Let your health care provider know about any changes, no matter how small.  If you are in your 20s or 30s, you should have a clinical breast exam (CBE)  by a health care provider every 1-3 years as part of a regular health exam.  If you are 40 or older, have a CBE every year. Also consider having a breast X-ray (mammogram) every year.  If you have a family history of breast cancer, talk to your health care provider about genetic screening.  If you are at high risk for breast cancer, talk to your health care provider about having an MRI and a mammogram every year.  Breast cancer gene (BRCA) assessment is recommended for women who have family members with BRCA-related cancers.  BRCA-related cancers include: ? Breast. ? Ovarian. ? Tubal. ? Peritoneal cancers.  Results of the assessment will determine the need for genetic counseling and BRCA1 and BRCA2 testing.  Cervical Cancer Your health care provider may recommend that you be screened regularly for cancer of the pelvic organs (ovaries, uterus, and vagina). This screening involves a pelvic examination, including checking for microscopic changes to the surface of your cervix (Pap test). You may be encouraged to have this screening done every 3 years, beginning at age 21.  For women ages 30-65, health care providers may recommend pelvic exams and Pap testing every 3 years, or they may recommend the Pap and pelvic exam, combined with testing for human papilloma virus (HPV), every 5 years. Some types of HPV increase your risk of cervical cancer. Testing for HPV may also be done on women of any age with unclear Pap test results.  Other health care providers may not recommend any screening for nonpregnant women who are considered low risk for pelvic cancer and who do not have symptoms. Ask your health care provider if a screening pelvic exam is right for you.  If you have had past treatment for cervical cancer or a condition that could lead to cancer, you need Pap tests and screening for cancer for at least 20 years after your treatment. If Pap tests have been discontinued, your risk factors (such as having a new sexual partner) need to be reassessed to determine if screening should resume. Some women have medical problems that increase the chance of getting cervical cancer. In these cases, your health care provider may recommend more frequent screening and Pap tests.  Colorectal Cancer  This type of cancer can be detected and often prevented.  Routine colorectal cancer screening usually begins at 33 years of age and continues through 33 years of age.  Your health care provider may recommend screening at an earlier age if  you have risk factors for colon cancer.  Your health care provider may also recommend using home test kits to check for hidden blood in the stool.  A small camera at the end of a tube can be used to examine your colon directly (sigmoidoscopy or colonoscopy). This is done to check for the earliest forms of colorectal cancer.  Routine screening usually begins at age 50.  Direct examination of the colon should be repeated every 5-10 years through 33 years of age. However, you may need to be screened more often if early forms of precancerous polyps or small growths are found.  Skin Cancer  Check your skin from head to toe regularly.  Tell your health care provider about any new moles or changes in moles, especially if there is a change in a mole's shape or color.  Also tell your health care provider if you have a mole that is larger than the size of a pencil eraser.  Always use sunscreen. Apply sunscreen liberally and   repeatedly throughout the day.  Protect yourself by wearing long sleeves, pants, a wide-brimmed hat, and sunglasses whenever you are outside.  Heart disease, diabetes, and high blood pressure  High blood pressure causes heart disease and increases the risk of stroke. High blood pressure is more likely to develop in: ? People who have blood pressure in the high end of the normal range (130-139/85-89 mm Hg). ? People who are overweight or obese. ? People who are African American.  If you are 18-39 years of age, have your blood pressure checked every 3-5 years. If you are 40 years of age or older, have your blood pressure checked every year. You should have your blood pressure measured twice-once when you are at a hospital or clinic, and once when you are not at a hospital or clinic. Record the average of the two measurements. To check your blood pressure when you are not at a hospital or clinic, you can use: ? An automated blood pressure machine at a pharmacy. ? A home blood  pressure monitor.  If you are between 55 years and 79 years old, ask your health care provider if you should take aspirin to prevent strokes.  Have regular diabetes screenings. This involves taking a blood sample to check your fasting blood sugar level. ? If you are at a normal weight and have a low risk for diabetes, have this test once every three years after 33 years of age. ? If you are overweight and have a high risk for diabetes, consider being tested at a younger age or more often. Preventing infection Hepatitis B  If you have a higher risk for hepatitis B, you should be screened for this virus. You are considered at high risk for hepatitis B if: ? You were born in a country where hepatitis B is common. Ask your health care provider which countries are considered high risk. ? Your parents were born in a high-risk country, and you have not been immunized against hepatitis B (hepatitis B vaccine). ? You have HIV or AIDS. ? You use needles to inject street drugs. ? You live with someone who has hepatitis B. ? You have had sex with someone who has hepatitis B. ? You get hemodialysis treatment. ? You take certain medicines for conditions, including cancer, organ transplantation, and autoimmune conditions.  Hepatitis C  Blood testing is recommended for: ? Everyone born from 1945 through 1965. ? Anyone with known risk factors for hepatitis C.  Sexually transmitted infections (STIs)  You should be screened for sexually transmitted infections (STIs) including gonorrhea and chlamydia if: ? You are sexually active and are younger than 33 years of age. ? You are older than 33 years of age and your health care provider tells you that you are at risk for this type of infection. ? Your sexual activity has changed since you were last screened and you are at an increased risk for chlamydia or gonorrhea. Ask your health care provider if you are at risk.  If you do not have HIV, but are at risk,  it may be recommended that you take a prescription medicine daily to prevent HIV infection. This is called pre-exposure prophylaxis (PrEP). You are considered at risk if: ? You are sexually active and do not regularly use condoms or know the HIV status of your partner(s). ? You take drugs by injection. ? You are sexually active with a partner who has HIV.  Talk with your health care provider about whether you   are at high risk of being infected with HIV. If you choose to begin PrEP, you should first be tested for HIV. You should then be tested every 3 months for as long as you are taking PrEP. Pregnancy  If you are premenopausal and you may become pregnant, ask your health care provider about preconception counseling.  If you may become pregnant, take 400 to 800 micrograms (mcg) of folic acid every day.  If you want to prevent pregnancy, talk to your health care provider about birth control (contraception). Osteoporosis and menopause  Osteoporosis is a disease in which the bones lose minerals and strength with aging. This can result in serious bone fractures. Your risk for osteoporosis can be identified using a bone density scan.  If you are 56 years of age or older, or if you are at risk for osteoporosis and fractures, ask your health care provider if you should be screened.  Ask your health care provider whether you should take a calcium or vitamin D supplement to lower your risk for osteoporosis.  Menopause may have certain physical symptoms and risks.  Hormone replacement therapy may reduce some of these symptoms and risks. Talk to your health care provider about whether hormone replacement therapy is right for you. Follow these instructions at home:  Schedule regular health, dental, and eye exams.  Stay current with your immunizations.  Do not use any tobacco products including cigarettes, chewing tobacco, or electronic cigarettes.  If you are pregnant, do not drink  alcohol.  If you are breastfeeding, limit how much and how often you drink alcohol.  Limit alcohol intake to no more than 1 drink per day for nonpregnant women. One drink equals 12 ounces of beer, 5 ounces of wine, or 1 ounces of hard liquor.  Do not use street drugs.  Do not share needles.  Ask your health care provider for help if you need support or information about quitting drugs.  Tell your health care provider if you often feel depressed.  Tell your health care provider if you have ever been abused or do not feel safe at home. This information is not intended to replace advice given to you by your health care provider. Make sure you discuss any questions you have with your health care provider. Document Released: 07/09/2010 Document Revised: 06/01/2015 Document Reviewed: 09/27/2014 Elsevier Interactive Patient Education  [employer].

## 2017-03-13 ENCOUNTER — Encounter: Payer: Self-pay | Admitting: Obstetrics and Gynecology

## 2017-03-13 LAB — POCT URINALYSIS DIPSTICK
BILIRUBIN UA: NEGATIVE
Glucose, UA: NEGATIVE
Ketones, UA: NEGATIVE
Leukocytes, UA: NEGATIVE
Nitrite, UA: NEGATIVE
Odor: NEGATIVE
PH UA: 6 (ref 5.0–8.0)
Protein, UA: NEGATIVE
RBC UA: NEGATIVE
UROBILINOGEN UA: 0.2 U/dL

## 2017-03-13 NOTE — Addendum Note (Signed)
Addended by: Elouise Munroe on: 03/13/2017 04:43 PM   Modules accepted: Orders

## 2017-03-15 LAB — URINE CULTURE: Organism ID, Bacteria: NO GROWTH

## 2017-06-12 ENCOUNTER — Ambulatory Visit (INDEPENDENT_AMBULATORY_CARE_PROVIDER_SITE_OTHER): Payer: No Typology Code available for payment source

## 2017-06-12 ENCOUNTER — Ambulatory Visit
Admission: RE | Admit: 2017-06-12 | Discharge: 2017-06-12 | Disposition: A | Discharge status: Home / Self Care | Payer: No Typology Code available for payment source | Source: Ambulatory Visit | Attending: Family Medicine | Admitting: Family Medicine

## 2017-06-12 ENCOUNTER — Other Ambulatory Visit: Payer: Self-pay | Admitting: Family Medicine

## 2017-06-12 ENCOUNTER — Ambulatory Visit (INDEPENDENT_AMBULATORY_CARE_PROVIDER_SITE_OTHER): Payer: No Typology Code available for payment source | Admitting: Obstetrics and Gynecology

## 2017-06-12 ENCOUNTER — Encounter: Payer: Self-pay | Admitting: Obstetrics and Gynecology

## 2017-06-12 VITALS — BP 113/79 | HR 93 | Ht 64.0 in | Wt 163.5 lb

## 2017-06-12 DIAGNOSIS — R300 Dysuria: Secondary | ICD-10-CM

## 2017-06-12 DIAGNOSIS — R1032 Left lower quadrant pain: Secondary | ICD-10-CM

## 2017-06-12 DIAGNOSIS — Z8742 Personal history of other diseases of the female genital tract: Secondary | ICD-10-CM | POA: Diagnosis not present

## 2017-06-12 DIAGNOSIS — K6389 Other specified diseases of intestine: Secondary | ICD-10-CM | POA: Insufficient documentation

## 2017-06-12 LAB — POCT URINALYSIS DIPSTICK
Bilirubin, UA: NEGATIVE
Blood, UA: NEGATIVE
GLUCOSE UA: NEGATIVE
Ketones, UA: NEGATIVE
LEUKOCYTES UA: NEGATIVE
NITRITE UA: NEGATIVE
Odor: NEGATIVE
PROTEIN UA: NEGATIVE
SPEC GRAV UA: 1.01 (ref 1.010–1.025)
UROBILINOGEN UA: 0.2 U/dL
pH, UA: 6.5 (ref 5.0–8.0)

## 2017-06-12 MED ORDER — IOHEXOL 300 MG/ML  SOLN
100.0000 mL | Freq: Once | INTRAMUSCULAR | Status: AC | PRN
  Administered 2017-06-12: 100 mL via INTRAVENOUS

## 2017-06-12 NOTE — Patient Instructions (Signed)
1.  Pelvic ultrasound is ordered 2.  Continue taking ibuprofen 800 mg 4 times a day. 3.  Add Tylenol 2 tablets every 4-6 hours as needed 4.  Return in 2 weeks for follow-up and further management planning

## 2017-06-12 NOTE — Progress Notes (Signed)
GYN ENCOUNTER NOTE  Subjective:       Connie Proctor is a 32 y.o. G33P1001 female is here for gynecologic evaluation of the following issues:  1.  Left lower quadrant pain x1 week  History of endometriosis. Currently on oral contraceptives with menstrual cycle due in 1 week Left lower quadrant pain of one-week duration; constant ache-like discomfort with intermittent sharp exacerbations lasting seconds; voiding hurts in the left lower quadrant during urination without dysuria.  Bowel movements have been loose the past week with loose stools x4; no blood in the stool. Currently taking ibuprofen 600 mg 4 times a day for pain; does not completely take pain away. No history of recent dyspareunia   Gynecologic History Patient's last menstrual period was 05/17/2017. Contraception: OCP (estrogen/progesterone)  Obstetric History OB History  Gravida Para Term Preterm AB Living  1 1 1     1   SAB TAB Ectopic Multiple Live Births          1    # Outcome Date GA Lbr Len/2nd Weight Sex Delivery Anes PTL Lv  1 Term    7 lb 9.6 oz (3.447 kg) M Vag-Spont   LIV     Complications: Malrotation of intestine    Past Medical History:  Diagnosis Date  . Complication of anesthesia    pt remembers waking up thrashing  . Endometriosis of uterus   . GERD (gastroesophageal reflux disease)   . Nontoxic uninodular goiter   . Rectal/anal hemorrhage   . Tendonitis   . Thyroid nodule 2010  . Vaginal Pap smear, abnormal    lgsil    Past Surgical History:  Procedure Laterality Date  . DIAGNOSTIC LAPAROSCOPY  2003  . THYROIDECTOMY N/A 07/20/2014   Procedure: THYROIDECTOMY;  Surgeon: Clyde Canterbury, MD;  Location: [facility] ORS;  Service: ENT;  Laterality: N/A;  . TONSILLECTOMY      Current Outpatient Medications on File Prior to Visit  Medication Sig Dispense Refill  . Ascorbic Acid (VITAMIN C) 1000 MG tablet Take 1,000 mg by mouth daily.    . drospirenone-ethinyl estradiol (YAZ,GIANVI,LORYNA) 3-0.02 MG  tablet Take 1 tablet by mouth daily. 3 Package 4  . levothyroxine (SYNTHROID, LEVOTHROID) 137 MCG tablet   1  . ranitidine (ZANTAC) 150 MG tablet Take by mouth.     No current facility-administered medications on file prior to visit.     Allergies  Allergen Reactions  . Tegaderm Ag Mesh [Silver] Itching and Rash    Social History   Socioeconomic History  . Marital status: Married    Spouse name: Not on file  . Number of children: Not on file  . Years of education: Not on file  . Highest education level: Not on file  Occupational History  . Not on file  Social Needs  . Financial resource strain: Not on file  . Food insecurity:    Worry: Not on file    Inability: Not on file  . Transportation needs:    Medical: Not on file    Non-medical: Not on file  Tobacco Use  . Smoking status: Never Smoker  . Smokeless tobacco: Never Used  Substance and Sexual Activity  . Alcohol use: Yes    Alcohol/week: 0.0 - 0.6 oz    Comment: occas  . Drug use: No  . Sexual activity: Yes    Birth control/protection: Pill  Lifestyle  . Physical activity:    Days per week: 2 days    Minutes per session: 30 min  .  Stress: Not on file  Relationships  . Social connections:    Talks on phone: Not on file    Gets together: Not on file    Attends religious service: Not on file    Active member of club or organization: Not on file    Attends meetings of clubs or organizations: Not on file    Relationship status: Not on file  . Intimate partner violence:    Fear of current or ex partner: Not on file    Emotionally abused: Not on file    Physically abused: Not on file    Forced sexual activity: Not on file  Other Topics Concern  . Not on file  Social History Narrative  . Not on file    Family History  Problem Relation Age of Onset  . Endometriosis Mother   . Endometriosis Sister   . Heart disease Paternal Grandmother   . Diabetes Paternal Grandfather   . Heart disease Paternal  Grandfather   . Cancer Neg Hx     The following portions of the patient's history were reviewed and updated as appropriate: allergies, current medications, past family history, past medical history, past social history, past surgical history and problem list.  Review of Systems Review of Systems -comprehensive review of systems is negative except for that noted in the HPI  Objective:   BP 113/79   Pulse 93   Ht 5\' 4"  (1.626 m)   Wt 163 lb 8 oz (74.2 kg)   LMP 05/17/2017   BMI 28.06 kg/m  CONSTITUTIONAL: Well-developed, well-nourished female in no acute distress.  HENT:  Normocephalic, atraumatic.  NECK: Not examined SKIN: Skin is warm and dry. No rash noted. Not diaphoretic. No erythema. No pallor. [place]: Alert and oriented to person, place, and time. PSYCHIATRIC: Normal mood and affect. Normal behavior. Normal judgment and thought content. CARDIOVASCULAR:Not Examined RESPIRATORY: Not Examined BREASTS: Not Examined BACK: No CVA tenderness; no spinal tenderness ABDOMEN: Soft, non distended; 2/4 tender in left lower quadrant without guarding or peritoneal signs;.  No Organomegaly. PELVIC:  External Genitalia: Normal  BUS: Normal  Vagina: Normal secretions  Cervix: Normal; eversion present; 1/4 cervical motion tenderness  Uterus: Normal size, shape,consistency, mobile, 1/4 tender  Adnexa: Normal right side; left side with fullness and 2/4 tender  RV: Normal external exam  Bladder: Nontender MUSCULOSKELETAL: Normal range of motion. No tenderness.  No cyanosis, clubbing, or edema.     Assessment:   1. Dysuria, with normal urinalysis - Urine Culture - POCT urinalysis dipstick  2. History of endometriosis, stable for several years on OCPs  3. Left lower quadrant pain, new onset x1 week; possible endometriosis flare versus ovarian cyst versus combination versus colitis     Plan:   1.  Pelvic ultrasound 2.  Continue ibuprofen 600 mg 4 times a day 3.  Add Tylenol  as needed to optimize analgesia support 4.  Continue with birth control pills 5.  Return in 2 weeks for follow-up and further management planning  Brayton Mars, MD  Note: This dictation was prepared with Dragon dictation along with smaller phrase technology. Any transcriptional errors that result from this process are unintentional.

## 2017-06-14 LAB — URINE CULTURE

## 2017-07-02 ENCOUNTER — Encounter: Payer: No Typology Code available for payment source | Admitting: Obstetrics and Gynecology

## 2018-03-17 ENCOUNTER — Encounter: Payer: No Typology Code available for payment source | Admitting: Obstetrics and Gynecology

## 2018-03-18 ENCOUNTER — Other Ambulatory Visit: Payer: Self-pay

## 2018-03-18 ENCOUNTER — Other Ambulatory Visit (HOSPITAL_COMMUNITY)
Admission: RE | Admit: 2018-03-18 | Discharge: 2018-03-18 | Disposition: A | Discharge status: Home / Self Care | Payer: No Typology Code available for payment source | Source: Ambulatory Visit | Attending: Obstetrics and Gynecology | Admitting: Obstetrics and Gynecology

## 2018-03-18 ENCOUNTER — Encounter: Payer: Self-pay | Admitting: Obstetrics and Gynecology

## 2018-03-18 ENCOUNTER — Ambulatory Visit (INDEPENDENT_AMBULATORY_CARE_PROVIDER_SITE_OTHER): Payer: No Typology Code available for payment source | Admitting: Obstetrics and Gynecology

## 2018-03-18 VITALS — BP 129/90 | HR 81 | Ht 64.0 in | Wt 160.6 lb

## 2018-03-18 DIAGNOSIS — N809 Endometriosis, unspecified: Secondary | ICD-10-CM

## 2018-03-18 DIAGNOSIS — E890 Postprocedural hypothyroidism: Secondary | ICD-10-CM

## 2018-03-18 DIAGNOSIS — E663 Overweight: Secondary | ICD-10-CM | POA: Diagnosis not present

## 2018-03-18 DIAGNOSIS — Z01419 Encounter for gynecological examination (general) (routine) without abnormal findings: Secondary | ICD-10-CM | POA: Diagnosis not present
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
## 2018-03-18 NOTE — Patient Instructions (Addendum)
Preventive Care 18-39 Years, Female °Preventive care refers to lifestyle choices and visits with your health care provider that can promote health and wellness. °What does preventive care include? ° °· A yearly physical exam. This is also called an annual well check. °· Dental exams once or twice a year. °· Routine eye exams. Ask your health care provider how often you should have your eyes checked. °· Personal lifestyle choices, including: °? Daily care of your teeth and gums. °? Regular physical activity. °? Eating a healthy diet. °? Avoiding tobacco and drug use. °? Limiting alcohol use. °? Practicing safe sex. °? Taking vitamin and mineral supplements as recommended by your health care provider. °What happens during an annual well check? °The services and screenings done by your health care provider during your annual well check will depend on your age, overall health, lifestyle risk factors, and family history of disease. °Counseling °Your health care provider may ask you questions about your: °· Alcohol use. °· Tobacco use. °· Drug use. °· Emotional well-being. °· Home and relationship well-being. °· Sexual activity. °· Eating habits. °· Work and work environment. °· Method of birth control. °· Menstrual cycle. °· Pregnancy history. °Screening °You may have the following tests or measurements: °· Height, weight, and BMI. °· Diabetes screening. This is done by checking your blood sugar (glucose) after you have not eaten for a while (fasting). °· Blood pressure. °· Lipid and cholesterol levels. These may be checked every 5 years starting at age 20. °· Skin check. °· Hepatitis C blood test. °· Hepatitis B blood test. °· Sexually transmitted disease (STD) testing. °· BRCA-related cancer screening. This may be done if you have a family history of breast, ovarian, tubal, or peritoneal cancers. °· Pelvic exam and Pap test. This may be done every 3 years starting at age 21. Starting at age 30, this may be done every 5  years if you have a Pap test in combination with an HPV test. °Discuss your test results, treatment options, and if necessary, the need for more tests with your health care provider. °Vaccines °Your health care provider may recommend certain vaccines, such as: °· Influenza vaccine. This is recommended every year. °· Tetanus, diphtheria, and acellular pertussis (Tdap, Td) vaccine. You may need a Td booster every 10 years. °· Varicella vaccine. You may need this if you have not been vaccinated. °· HPV vaccine. If you are 26 or younger, you may need three doses over 6 months. °· Measles, mumps, and rubella (MMR) vaccine. You may need at least one dose of MMR. You may also need a second dose. °· Pneumococcal 13-valent conjugate (PCV13) vaccine. You may need this if you have certain conditions and were not previously vaccinated. °· Pneumococcal polysaccharide (PPSV23) vaccine. You may need one or two doses if you smoke cigarettes or if you have certain conditions. °· Meningococcal vaccine. One dose is recommended if you are age 19-21 years and a first-year college student living in a residence hall, or if you have one of several medical conditions. You may also need additional booster doses. °· Hepatitis A vaccine. You may need this if you have certain conditions or if you travel or work in places where you may be exposed to hepatitis A. °· Hepatitis B vaccine. You may need this if you have certain conditions or if you travel or work in places where you may be exposed to hepatitis B. °· Haemophilus influenzae type b (Hib) vaccine. You may need this if you   have certain risk factors. Talk to your health care provider about which screenings and vaccines you need and how often you need them. This information is not intended to replace advice given to you by your health care provider. Make sure you discuss any questions you have with your health care provider. Document Released: 02/19/2001 Document Revised: 08/06/2016  Document Reviewed: 10/25/2014 Elsevier Interactive Patient Education  2019 [place] Breast self-awareness means:  Knowing how your breasts look.  Knowing how your breasts feel.  Checking your breasts every month for changes.  Telling your doctor if you notice a change in your breasts. Breast self-awareness allows you to notice a breast problem early while it is still small. How to do a breast self-exam One way to learn what is normal for your breasts and to check for changes is to do a breast self-exam. To do a breast self-exam: Look for Changes  1. Take off all the clothes above your waist. 2. Stand in front of a mirror in a room with good lighting. 3. Put your hands on your hips. 4. Push your hands down. 5. Look at your breasts and nipples in the mirror to see if one breast or nipple looks different than the other. Check to see if: ? The shape of one breast is different. ? The size of one breast is different. ? There are wrinkles, dips, and bumps in one breast and not the other. 6. Look at each breast for changes in your skin, such as: ? Redness. ? Scaly areas. 7. Look for changes in your nipples, such as: ? Liquid around the nipples. ? Bleeding. ? Dimpling. ? Redness. ? A change in where the nipples are. Feel for Changes 1. Lie on your back on the floor. 2. Feel each breast. To do this, follow these steps: ? Pick a breast to feel. ? Put the arm closest to that breast above your head. ? Use your other arm to feel the nipple area of your breast. Feel the area with the pads of your three middle fingers by making small circles with your fingers. For the first circle, press lightly. For the second circle, press harder. For the third circle, press even harder. ? Keep making circles with your fingers at the light, harder, and even harder pressures as you move down your breast. Stop when you feel your ribs. ? Move your fingers a little toward the center  of your body. ? Start making circles with your fingers again, this time going up until you reach your collarbone. ? Keep making up and down circles until you reach your armpit. Remember to keep using the three pressures. ? Feel the other breast in the same way. 3. Sit or stand in the shower or tub. 4. With soapy water on your skin, feel each breast the same way you did in step 2, when you were lying on the floor. Write Down What You Find After doing the self-exam, write down:  What is normal for each breast.  Any changes you find in each breast.  When you last had your period.  How often should I check my breasts? Check your breasts every month. If you are breastfeeding, the best time to check them is after you feed your baby or after you use a breast pump. If you get periods, the best time to check your breasts is 5-7 days after your period is over. When should I see my doctor? See your doctor if you notice:  A change in shape or size of your breasts or nipples.  A change in the skin of your breast or nipples, such as red or scaly skin.  Unusual fluid coming from your nipples.  A lump or thick area that was not there before.  Pain in your breasts.  Anything that concerns you. This information is not intended to replace advice given to you by your health care provider. Make sure you discuss any questions you have with your health care provider. Document Released: 06/12/2007 Document Revised: 06/01/2015 Document Reviewed: 11/13/2014 Elsevier Interactive Patient Education  2019 [place] 18-39 Years, Female Preventive care refers to lifestyle choices and visits with your health care provider that can promote health and wellness. What does preventive care include?   A yearly physical exam. This is also called an annual well check.  Dental exams once or twice a year.  Routine eye exams. Ask your health care provider how often you should have your eyes  checked.  Personal lifestyle choices, including: ? Daily care of your teeth and gums. ? Regular physical activity. ? Eating a healthy diet. ? Avoiding tobacco and drug use. ? Limiting alcohol use. ? Practicing safe sex. ? Taking vitamin and mineral supplements as recommended by your health care provider. What happens during an annual well check? The services and screenings done by your health care provider during your annual well check will depend on your age, overall health, lifestyle risk factors, and family history of disease. Counseling Your health care provider may ask you questions about your:  Alcohol use.  Tobacco use.  Drug use.  Emotional well-being.  Home and relationship well-being.  Sexual activity.  Eating habits.  Work and work Statistician.  Method of birth control.  Menstrual cycle.  Pregnancy history. Screening You may have the following tests or measurements:  Height, weight, and BMI.  Diabetes screening. This is done by checking your blood sugar (glucose) after you have not eaten for a while (fasting).  Blood pressure.  Lipid and cholesterol levels. These may be checked every 5 years starting at age 58.  Skin check.  Hepatitis C blood test.  Hepatitis B blood test.  Sexually transmitted disease (STD) testing.  BRCA-related cancer screening. This may be done if you have a family history of breast, ovarian, tubal, or peritoneal cancers.  Pelvic exam and Pap test. This may be done every 3 years starting at age 76. Starting at age 5, this may be done every 5 years if you have a Pap test in combination with an HPV test. Discuss your test results, treatment options, and if necessary, the need for more tests with your health care provider. Vaccines Your health care provider may recommend certain vaccines, such as:  Influenza vaccine. This is recommended every year.  Tetanus, diphtheria, and acellular pertussis (Tdap, Td) vaccine. You may  need a Td booster every 10 years.  Varicella vaccine. You may need this if you have not been vaccinated.  HPV vaccine. If you are 33 or younger, you may need three doses over 6 months.  Measles, mumps, and rubella (MMR) vaccine. You may need at least one dose of MMR. You may also need a second dose.  Pneumococcal 13-valent conjugate (PCV13) vaccine. You may need this if you have certain conditions and were not previously vaccinated.  Pneumococcal polysaccharide (PPSV23) vaccine. You may need one or two doses if you smoke cigarettes or if you have certain conditions.  Meningococcal vaccine. One dose  is recommended if you are age 95-21 years and a first-year college student living in a residence hall, or if you have one of several medical conditions. You may also need additional booster doses.  Hepatitis A vaccine. You may need this if you have certain conditions or if you travel or work in places where you may be exposed to hepatitis A.  Hepatitis B vaccine. You may need this if you have certain conditions or if you travel or work in places where you may be exposed to hepatitis B.  Haemophilus influenzae type b (Hib) vaccine. You may need this if you have certain risk factors. Talk to your health care provider about which screenings and vaccines you need and how often you need them. This information is not intended to replace advice given to you by your health care provider. Make sure you discuss any questions you have with your health care provider. Document Released: 02/19/2001 Document Revised: 08/06/2016 Document Reviewed: 10/25/2014 Elsevier Interactive Patient Education  2019 [employer].

## 2018-03-18 NOTE — Progress Notes (Signed)
PT is present today for her annual exam. Pt stated that she has been doing self-breast exams monthly. Pt stated that she is doing well and denies any issues. No problems or concerns.     

## 2018-03-20 LAB — CYTOLOGY - PAP
Diagnosis: NEGATIVE
HPV: NOT DETECTED

## 2018-03-30 MED FILL — LEVOTHYROXINE 150 MCG TAB: 150 | 30 days supply | Qty: 30 | Fill #0 | Status: TO

## 2018-06-03 ENCOUNTER — Other Ambulatory Visit: Payer: Self-pay | Admitting: Obstetrics and Gynecology

## 2019-03-19 ENCOUNTER — Encounter: Payer: No Typology Code available for payment source | Admitting: Obstetrics and Gynecology

## 2019-03-19 ENCOUNTER — Encounter: Payer: Self-pay | Admitting: Obstetrics and Gynecology

## 2019-03-19 ENCOUNTER — Ambulatory Visit (INDEPENDENT_AMBULATORY_CARE_PROVIDER_SITE_OTHER): Payer: No Typology Code available for payment source | Admitting: Obstetrics and Gynecology

## 2019-03-19 ENCOUNTER — Other Ambulatory Visit: Payer: Self-pay

## 2019-03-19 VITALS — BP 110/77 | HR 81 | Ht 64.0 in | Wt 171.9 lb

## 2019-03-19 DIAGNOSIS — N809 Endometriosis, unspecified: Secondary | ICD-10-CM

## 2019-03-19 DIAGNOSIS — E663 Overweight: Secondary | ICD-10-CM

## 2019-03-19 DIAGNOSIS — Z01419 Encounter for gynecological examination (general) (routine) without abnormal findings: Secondary | ICD-10-CM | POA: Diagnosis not present

## 2019-03-19 DIAGNOSIS — E890 Postprocedural hypothyroidism: Secondary | ICD-10-CM | POA: Diagnosis not present

## 2019-03-19 NOTE — Patient Instructions (Addendum)
Preventive Care 21-35 Years Old, Female Preventive care refers to visits with your health care provider and lifestyle choices that can promote health and wellness. This includes:  A yearly physical exam. This may also be called an annual well check.  Regular dental visits and eye exams.  Immunizations.  Screening for certain conditions.  Healthy lifestyle choices, such as eating a healthy diet, getting regular exercise, not using drugs or products that contain nicotine and tobacco, and limiting alcohol use. What can I expect for my preventive care visit? Physical exam Your health care provider will check your:  Height and weight. This may be used to calculate body mass index (BMI), which tells if you are at a healthy weight.  Heart rate and blood pressure.  Skin for abnormal spots. Counseling Your health care provider may ask you questions about your:  Alcohol, tobacco, and drug use.  Emotional well-being.  Home and relationship well-being.  Sexual activity.  Eating habits.  Work and work environment.  Method of birth control.  Menstrual cycle.  Pregnancy history. What immunizations do I need?  Influenza (flu) vaccine  This is recommended every year. Tetanus, diphtheria, and pertussis (Tdap) vaccine  You may need a Td booster every 10 years. Varicella (chickenpox) vaccine  You may need this if you have not been vaccinated. Human papillomavirus (HPV) vaccine  If recommended by your health care provider, you may need three doses over 6 months. Measles, mumps, and rubella (MMR) vaccine  You may need at least one dose of MMR. You may also need a second dose. Meningococcal conjugate (MenACWY) vaccine  One dose is recommended if you are age 19-21 years and a first-year college student living in a residence hall, or if you have one of several medical conditions. You may also need additional booster doses. Pneumococcal conjugate (PCV13) vaccine  You may need  this if you have certain conditions and were not previously vaccinated. Pneumococcal polysaccharide (PPSV23) vaccine  You may need one or two doses if you smoke cigarettes or if you have certain conditions. Hepatitis A vaccine  You may need this if you have certain conditions or if you travel or work in places where you may be exposed to hepatitis A. Hepatitis B vaccine  You may need this if you have certain conditions or if you travel or work in places where you may be exposed to hepatitis B. Haemophilus influenzae type b (Hib) vaccine  You may need this if you have certain conditions. You may receive vaccines as individual doses or as more than one vaccine together in one shot (combination vaccines). Talk with your health care provider about the risks and benefits of combination vaccines. What tests do I need?  Blood tests  Lipid and cholesterol levels. These may be checked every 5 years starting at age 20.  Hepatitis C test.  Hepatitis B test. Screening  Diabetes screening. This is done by checking your blood sugar (glucose) after you have not eaten for a while (fasting).  Sexually transmitted disease (STD) testing.  BRCA-related cancer screening. This may be done if you have a family history of breast, ovarian, tubal, or peritoneal cancers.  Pelvic exam and Pap test. This may be done every 3 years starting at age 21. Starting at age 30, this may be done every 5 years if you have a Pap test in combination with an HPV test. Talk with your health care provider about your test results, treatment options, and if necessary, the need for more tests.   Follow these instructions at home: Eating and drinking   Eat a diet that includes fresh fruits and vegetables, whole grains, lean protein, and low-fat dairy.  Take vitamin and mineral supplements as recommended by your health care provider.  Do not drink alcohol if: ? Your health care provider tells you not to drink. ? You are  pregnant, may be pregnant, or are planning to become pregnant.  If you drink alcohol: ? Limit how much you have to 0-1 drink a day. ? Be aware of how much alcohol is in your drink. In the [place] one drink equals one 12 oz bottle of beer (355 mL), one 5 oz glass of wine (148 mL), or one 1 oz glass of hard liquor (44 mL). Lifestyle  Take daily care of your teeth and gums.  Stay active. Exercise for at least 30 minutes on 5 or more days each week.  Do not use any products that contain nicotine or tobacco, such as cigarettes, e-cigarettes, and chewing tobacco. If you need help quitting, ask your health care provider.  If you are sexually active, practice safe sex. Use a condom or other form of birth control (contraception) in order to prevent pregnancy and STIs (sexually transmitted infections). If you plan to become pregnant, see your health care provider for a preconception visit. What's next?  Visit your health care provider once a year for a well check visit.  Ask your health care provider how often you should have your eyes and teeth checked.  Stay up to date on all vaccines. This information is not intended to replace advice given to you by your health care provider. Make sure you discuss any questions you have with your health care provider. Document Revised: 09/04/2017 Document Reviewed: 09/04/2017 Elsevier Patient Education  2020 Elsevier Inc. Breast Self-Awareness Breast self-awareness is knowing how your breasts look and feel. Doing breast self-awareness is important. It allows you to catch a breast problem early while it is still small and can be treated. All women should do breast self-awareness, including women who have had breast implants. Tell your doctor if you notice a change in your breasts. What you need:  A mirror.  A well-lit room. How to do a breast self-exam A breast self-exam is one way to learn what is normal for your breasts and to check for changes. To do a  breast self-exam: Look for changes  1. Take off all the clothes above your waist. 2. Stand in front of a mirror in a room with good lighting. 3. Put your hands on your hips. 4. Push your hands down. 5. Look at your breasts and nipples in the mirror to see if one breast or nipple looks different from the other. Check to see if: ? The shape of one breast is different. ? The size of one breast is different. ? There are wrinkles, dips, and bumps in one breast and not the other. 6. Look at each breast for changes in the skin, such as: ? Redness. ? Scaly areas. 7. Look for changes in your nipples, such as: ? Liquid around the nipples. ? Bleeding. ? Dimpling. ? Redness. ? A change in where the nipples are. Feel for changes  1. Lie on your back on the floor. 2. Feel each breast. To do this, follow these steps: ? Pick a breast to feel. ? Put the arm closest to that breast above your head. ? Use your other arm to feel the nipple area of your breast. Feel   the area with the pads of your three middle fingers by making small circles with your fingers. For the first circle, press lightly. For the second circle, press harder. For the third circle, press even harder. ? Keep making circles with your fingers at the different pressures as you move down your breast. Stop when you feel your ribs. ? Move your fingers a little toward the center of your body. ? Start making circles with your fingers again, this time going up until you reach your collarbone. ? Keep making up-and-down circles until you reach your armpit. Remember to keep using the three pressures. ? Feel the other breast in the same way. 3. Sit or stand in the tub or shower. 4. With soapy water on your skin, feel each breast the same way you did in step 2 when you were lying on the floor. Write down what you find Writing down what you find can help you remember what to tell your doctor. Write down:  What is normal for each breast.  Any  changes you find in each breast, including: ? The kind of changes you find. ? Whether you have pain. ? Size and location of any lumps.  When you last had your menstrual period. General tips  Check your breasts every month.  If you are breastfeeding, the best time to check your breasts is after you feed your baby or after you use a breast pump.  If you get menstrual periods, the best time to check your breasts is 5-7 days after your menstrual period is over.  With time, you will become comfortable with the self-exam, and you will begin to know if there are changes in your breasts. Contact a doctor if you:  See a change in the shape or size of your breasts or nipples.  See a change in the skin of your breast or nipples, such as red or scaly skin.  Have fluid coming from your nipples that is not normal.  Find a lump or thick area that was not there before.  Have pain in your breasts.  Have any concerns about your breast health. Summary  Breast self-awareness includes looking for changes in your breasts, as well as feeling for changes within your breasts.  Breast self-awareness should be done in front of a mirror in a well-lit room.  You should check your breasts every month. If you get menstrual periods, the best time to check your breasts is 5-7 days after your menstrual period is over.  Let your doctor know of any changes you see in your breasts, including changes in size, changes on the skin, pain or tenderness, or fluid from your nipples that is not normal. This information is not intended to replace advice given to you by your health care provider. Make sure you discuss any questions you have with your health care provider. Document Revised: 08/12/2017 Document Reviewed: 08/12/2017 Elsevier Patient Education  2020 Elsevier Inc.   Preparing for Pregnancy If you are considering becoming pregnant, make an appointment to see your regular health care provider to learn how to  prepare for a safe and healthy pregnancy (preconception care). During a preconception care visit, your health care provider will:  Do a complete physical exam, including a Pap test.  Take a complete medical history.  Give you information, answer your questions, and help you resolve problems. Preconception checklist Medical history  Tell your health care provider about any current or past medical conditions. Your pregnancy or your ability to   become pregnant may be affected by chronic conditions, such as diabetes, chronic hypertension, and thyroid problems.  Include your family's medical history as well as your partner's medical history.  Tell your health care provider about any history of STIs (sexually transmitted infections).These can affect your pregnancy. In some cases, they can be passed to your baby. Discuss any concerns that you have about STIs.  If indicated, discuss the benefits of genetic testing. This testing will show whether there are any genetic conditions that may be passed from you or your partner to your baby.  Tell your health care provider about: ? Any problems you have had with conception or pregnancy. ? Any medicines you take. These include vitamins, herbal supplements, and over-the-counter medicines. ? Your history of immunizations. Discuss any vaccinations that you may need. Diet  Ask your health care provider what to include in a healthy diet that has a balance of nutrients. This is especially important when you are pregnant or preparing to become pregnant.  Ask your health care provider to help you reach a healthy weight before pregnancy. ? If you are overweight, you may be at higher risk for certain complications, such as high blood pressure, diabetes, and preterm birth. ? If you are underweight, you are more likely to have a baby who has a low birth weight. Lifestyle, work, and home  Let your health care provider know: ? About any lifestyle habits that you  have, such as alcohol use, drug use, or smoking. ? About recreational activities that may put you at risk during pregnancy, such as downhill skiing and certain exercise programs. ? Tell your health care provider about any international travel, especially any travel to places with an active Zika virus outbreak. ? About harmful substances that you may be exposed to at work or at home. These include chemicals, pesticides, radiation, or even litter boxes. ? If you do not feel safe at home. Mental health  Tell your health care provider about: ? Any history of mental health conditions, including feelings of depression, sadness, or anxiety. ? Any medicines that you take for a mental health condition. These include herbs and supplements. Home instructions to prepare for pregnancy Lifestyle   Eat a balanced diet. This includes fresh fruits and vegetables, whole grains, lean meats, low-fat dairy products, healthy fats, and foods that are high in fiber. Ask to meet with a nutritionist or registered dietitian for assistance with meal planning and goals.  Get regular exercise. Try to be active for at least 30 minutes a day on most days of the week. Ask your health care provider which activities are safe during pregnancy.  Do not use any products that contain nicotine or tobacco, such as cigarettes and e-cigarettes. If you need help quitting, ask your health care provider.  Do not drink alcohol.  Do not take illegal drugs.  Maintain a healthy weight. Ask your health care provider what weight range is right for you. General instructions  Keep an accurate record of your menstrual periods. This makes it easier for your health care provider to determine your baby's due date.  Begin taking prenatal vitamins and folic acid supplements daily as directed by your health care provider.  Manage any chronic conditions, such as high blood pressure and diabetes, as told by your health care provider. This is  important. How do I know that I am pregnant? You may be pregnant if you have been sexually active and you miss your period. Symptoms of early pregnancy include:    Mild cramping.  Very light vaginal bleeding (spotting).  Feeling unusually tired.  Nausea and vomiting (morning sickness). If you have any of these symptoms and you suspect that you might be pregnant, you can take a home pregnancy test. These tests check for a hormone in your urine (human chorionic gonadotropin, or hCG). A woman's body begins to make this hormone during early pregnancy. These tests are very accurate. Wait until at least the first day after you miss your period to take one. If the test shows that you are pregnant (you get a positive result), call your health care provider to make an appointment for prenatal care. What should I do if I become pregnant?      Make an appointment with your health care provider as soon as you suspect you are pregnant.  Do not use any products that contain nicotine, such as cigarettes, chewing tobacco, and e-cigarettes. If you need help quitting, ask your health care provider.  Do not drink alcoholic beverages. Alcohol is related to a number of birth defects.  Avoid toxic odors and chemicals.  You may continue to have sexual intercourse if it does not cause pain or other problems, such as vaginal bleeding. This information is not intended to replace advice given to you by your health care provider. Make sure you discuss any questions you have with your health care provider. Document Revised: 12/26/2016 Document Reviewed: 07/16/2015 Elsevier Patient Education  2020 Elsevier Inc.  
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
## 2019-03-19 NOTE — Progress Notes (Signed)
Pt present for annual exam. Pt stated that she took her last birth control pills due to her wanting to conceive. Pt had flu vaccine in sept 2020 and both covid vaccines.

## 2019-05-10 ENCOUNTER — Telehealth: Payer: No Typology Code available for payment source | Admitting: Physician Assistant

## 2019-05-10 DIAGNOSIS — R05 Cough: Secondary | ICD-10-CM | POA: Diagnosis not present

## 2019-05-10 DIAGNOSIS — B9789 Other viral agents as the cause of diseases classified elsewhere: Secondary | ICD-10-CM | POA: Diagnosis not present

## 2019-05-10 DIAGNOSIS — R059 Cough, unspecified: Secondary | ICD-10-CM

## 2019-05-10 DIAGNOSIS — J019 Acute sinusitis, unspecified: Secondary | ICD-10-CM

## 2019-05-10 MED ORDER — BENZONATATE 100 MG PO CAPS: 100.0000 mg | ORAL_CAPSULE | Freq: Three times a day (TID) | ORAL | 0 refills | Status: DC | PRN

## 2019-05-10 MED ORDER — AZELASTINE HCL 0.1 % NA SOLN: 1.0000 | Freq: Two times a day (BID) | NASAL | 0 refills | Status: DC

## 2019-05-10 NOTE — Progress Notes (Signed)
We are sorry that you are not feeling well.  Here is how we plan to help!   Based on what you have shared with me it looks like you have sinusitis.  Sinusitis is inflammation and infection in the sinus cavities of the head.  Based on your presentation I believe you most likely have Acute Viral Sinusitis. This is an infection most likely caused by a virus. There is not specific treatment for viral sinusitis other than to help you with the symptoms until the infection runs its course.    You may use an oral decongestant such as Mucinex D or if you have glaucoma or high blood pressure use plain Mucinex. Saline nasal spray help and can safely be used as often as needed for congestion, I have prescribed: Tessalon capsules for cough and Azelastine nasal spray 2 sprays in each nostril twice a day  Some authorities believe that zinc sprays or the use of Echinacea may shorten the course of your symptoms.  Sinus infections are not as easily transmitted as other respiratory infection, however we still recommend that you avoid close contact with loved ones, especially the very young and elderly.  Remember to wash your hands thoroughly throughout the day as this is the number one way to prevent the spread of infection!  Home Care:  Only take medications as instructed by your medical team.  Do not take these medications with alcohol.  A steam or ultrasonic humidifier can help congestion.  You can place a towel over your head and breathe in the steam from hot water coming from a faucet.  Avoid close contacts especially the very young and the elderly.  Cover your mouth when you cough or sneeze.  Always remember to wash your hands.  Get Help Right Away If:  You develop worsening fever or sinus pain.  You develop a severe head ache or visual changes.  Your symptoms persist after you have completed your treatment plan.  Make sure you  Understand these instructions.  Will watch your  condition.  Will get help right away if you are not doing well or get worse.  Your e-visit answers were reviewed by a board certified advanced clinical practitioner to complete your personal care plan.  Depending on the condition, your plan could have included both over the counter or prescription medications.  If there is a problem please reply  once you have received a response from your provider.  Your safety is important to [place].  If you have drug allergies check your prescription carefully.    You can use MyChart to ask questions about today's visit, request a non-urgent call back, or ask for a work or school excuse for 24 hours related to this e-Visit. If it has been greater than 24 hours you will need to follow up with your provider, or enter a new e-Visit to address those concerns.  You will get an e-mail in the next two days asking about your experience.  I hope that your e-visit has been valuable and will speed your recovery. Thank you for using e-visits.   Greater than 5 minutes, yet less than 10 minutes of time have been spent researching, coordinating and implementing care for this patient today.

## 2019-05-15 ENCOUNTER — Telehealth: Payer: No Typology Code available for payment source | Admitting: Nurse Practitioner

## 2019-05-15 DIAGNOSIS — J0100 Acute maxillary sinusitis, unspecified: Secondary | ICD-10-CM

## 2019-05-15 MED ORDER — AMOXICILLIN-POT CLAVULANATE 875-125 MG PO TABS: 1.0000 | ORAL_TABLET | Freq: Two times a day (BID) | ORAL | 0 refills | Status: DC

## 2019-05-15 NOTE — Progress Notes (Signed)

## 2019-08-03 ENCOUNTER — Other Ambulatory Visit: Payer: Self-pay | Admitting: Obstetrics and Gynecology

## 2019-08-04 ENCOUNTER — Other Ambulatory Visit: Payer: Self-pay | Admitting: Obstetrics and Gynecology

## 2020-01-22 ENCOUNTER — Other Ambulatory Visit: Payer: Self-pay

## 2020-01-22 ENCOUNTER — Other Ambulatory Visit: Payer: No Typology Code available for payment source

## 2020-01-22 DIAGNOSIS — Z20822 Contact with and (suspected) exposure to covid-19: Secondary | ICD-10-CM

## 2020-01-25 LAB — NOVEL CORONAVIRUS, NAA: SARS-CoV-2, NAA: NOT DETECTED

## 2020-03-01 ENCOUNTER — Other Ambulatory Visit: Payer: Self-pay | Admitting: Family Medicine

## 2020-03-23 ENCOUNTER — Other Ambulatory Visit: Payer: Self-pay

## 2020-03-23 ENCOUNTER — Encounter: Payer: Self-pay | Admitting: Physical Therapy

## 2020-03-23 ENCOUNTER — Ambulatory Visit: Payer: No Typology Code available for payment source | Attending: Family Medicine | Admitting: Physical Therapy

## 2020-03-23 DIAGNOSIS — M542 Cervicalgia: Secondary | ICD-10-CM | POA: Insufficient documentation

## 2020-03-23 DIAGNOSIS — M546 Pain in thoracic spine: Secondary | ICD-10-CM | POA: Diagnosis present

## 2020-03-23 DIAGNOSIS — M791 Myalgia, unspecified site: Secondary | ICD-10-CM

## 2020-03-23 DIAGNOSIS — G589 Mononeuropathy, unspecified: Secondary | ICD-10-CM
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
## 2020-03-24 ENCOUNTER — Encounter: Payer: No Typology Code available for payment source | Admitting: Obstetrics and Gynecology

## 2020-03-24 NOTE — Addendum Note (Signed)
Addended by: Kelton Pillar on: 03/24/2020 09:49 AM   Modules accepted: Orders

## 2020-03-27 ENCOUNTER — Encounter: Payer: No Typology Code available for payment source | Admitting: Physical Therapy

## 2020-03-28 ENCOUNTER — Other Ambulatory Visit: Payer: Self-pay | Admitting: Family Medicine

## 2020-03-31 ENCOUNTER — Other Ambulatory Visit: Payer: Self-pay

## 2020-03-31 ENCOUNTER — Ambulatory Visit: Payer: No Typology Code available for payment source | Admitting: Physical Therapy

## 2020-03-31 ENCOUNTER — Encounter: Payer: Self-pay | Admitting: Physical Therapy

## 2020-03-31 DIAGNOSIS — M542 Cervicalgia: Secondary | ICD-10-CM
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
## 2020-04-04 ENCOUNTER — Encounter: Payer: No Typology Code available for payment source | Admitting: Physical Therapy

## 2020-04-07 ENCOUNTER — Other Ambulatory Visit: Payer: Self-pay

## 2020-04-07 ENCOUNTER — Ambulatory Visit: Payer: No Typology Code available for payment source | Attending: Family Medicine | Admitting: Physical Therapy

## 2020-04-07 ENCOUNTER — Encounter: Payer: Self-pay | Admitting: Physical Therapy

## 2020-04-07 DIAGNOSIS — M546 Pain in thoracic spine: Secondary | ICD-10-CM | POA: Diagnosis present

## 2020-04-07 DIAGNOSIS — M542 Cervicalgia: Secondary | ICD-10-CM | POA: Diagnosis present
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
## 2020-04-10 ENCOUNTER — Encounter: Payer: No Typology Code available for payment source | Admitting: Physical Therapy

## 2020-04-14 ENCOUNTER — Ambulatory Visit: Payer: No Typology Code available for payment source | Admitting: Physical Therapy

## 2020-04-14 ENCOUNTER — Other Ambulatory Visit: Payer: Self-pay

## 2020-04-14 ENCOUNTER — Encounter: Payer: Self-pay | Admitting: Physical Therapy

## 2020-04-14 DIAGNOSIS — M542 Cervicalgia: Secondary | ICD-10-CM | POA: Diagnosis not present

## 2020-04-14 DIAGNOSIS — M546 Pain in thoracic spine: Secondary | ICD-10-CM
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
## 2020-04-17 ENCOUNTER — Encounter: Payer: No Typology Code available for payment source | Admitting: Physical Therapy

## 2020-04-21 ENCOUNTER — Other Ambulatory Visit: Payer: Self-pay

## 2020-04-21 ENCOUNTER — Ambulatory Visit: Payer: No Typology Code available for payment source | Admitting: Physical Therapy

## 2020-04-21 ENCOUNTER — Encounter: Payer: Self-pay | Admitting: Physical Therapy

## 2020-04-21 DIAGNOSIS — M546 Pain in thoracic spine: Secondary | ICD-10-CM

## 2020-04-21 DIAGNOSIS — M542 Cervicalgia: Secondary | ICD-10-CM | POA: Diagnosis not present
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
## 2020-04-24 ENCOUNTER — Encounter: Payer: No Typology Code available for payment source | Admitting: Physical Therapy

## 2020-04-28 ENCOUNTER — Ambulatory Visit: Payer: No Typology Code available for payment source | Admitting: Physical Therapy

## 2020-05-01 ENCOUNTER — Encounter: Payer: No Typology Code available for payment source | Admitting: Physical Therapy

## 2020-05-05 ENCOUNTER — Encounter: Payer: No Typology Code available for payment source | Admitting: Physical Therapy

## 2020-05-16 ENCOUNTER — Encounter: Payer: No Typology Code available for payment source | Admitting: Obstetrics and Gynecology

## 2020-05-18 NOTE — Progress Notes (Signed)
Pt present for annual exam. Pt stated that she was doing well. Pap up to date.

## 2020-05-19 ENCOUNTER — Ambulatory Visit (INDEPENDENT_AMBULATORY_CARE_PROVIDER_SITE_OTHER): Payer: No Typology Code available for payment source | Admitting: Obstetrics and Gynecology

## 2020-05-19 ENCOUNTER — Encounter: Payer: Self-pay | Admitting: Obstetrics and Gynecology

## 2020-05-19 ENCOUNTER — Other Ambulatory Visit: Payer: Self-pay

## 2020-05-19 VITALS — BP 123/86 | HR 67 | Ht 64.0 in | Wt 163.9 lb

## 2020-05-19 DIAGNOSIS — N809 Endometriosis, unspecified: Secondary | ICD-10-CM | POA: Diagnosis not present

## 2020-05-19 DIAGNOSIS — E890 Postprocedural hypothyroidism: Secondary | ICD-10-CM | POA: Diagnosis not present

## 2020-05-19 DIAGNOSIS — Z01419 Encounter for gynecological examination (general) (routine) without abnormal findings: Secondary | ICD-10-CM

## 2020-05-19 DIAGNOSIS — Z3041 Encounter for surveillance of contraceptive pills: Secondary | ICD-10-CM

## 2020-05-19 DIAGNOSIS — E663 Overweight: Secondary | ICD-10-CM

## 2020-05-19 MED ORDER — DROSPIRENONE-ETHINYL ESTRADIOL 3-0.02 MG PO TABS
1.0000 | ORAL_TABLET | Freq: Every day | ORAL | 3 refills | Status: DC
Start: 2020-05-19 — End: 2021-05-24
  Filled 2020-05-19 – 2020-05-29 (×2): qty 84, 84d supply, fill #0
  Filled 2020-09-23 – 2020-09-25 (×2): qty 84, 84d supply, fill #1
  Filled 2020-12-12: qty 84, 84d supply, fill #2
  Filled 2021-03-06: qty 84, 84d supply, fill #3
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
## 2020-05-26 ENCOUNTER — Encounter: Payer: No Typology Code available for payment source | Admitting: Obstetrics and Gynecology

## 2020-05-29 ENCOUNTER — Other Ambulatory Visit: Payer: Self-pay

## 2020-06-20 ENCOUNTER — Other Ambulatory Visit: Payer: Self-pay

## 2020-06-20 MED FILL — Levothyroxine Sodium Tab 125 MCG: ORAL | 90 days supply | Qty: 90 | Fill #0 | Status: AC

## 2020-09-18 ENCOUNTER — Other Ambulatory Visit: Payer: Self-pay

## 2020-09-19 ENCOUNTER — Other Ambulatory Visit: Payer: Self-pay

## 2020-09-19 MED ORDER — LEVOTHYROXINE SODIUM 125 MCG PO TABS
ORAL_TABLET | ORAL | 1 refills | Status: DC
  Filled 2020-09-19: qty 90, 90d supply, fill #0
  Filled 2020-12-12: qty 90, 90d supply, fill #1

## 2020-09-25 ENCOUNTER — Other Ambulatory Visit: Payer: Self-pay

## 2020-10-04 ENCOUNTER — Other Ambulatory Visit: Payer: Self-pay

## 2020-10-04 ENCOUNTER — Emergency Department
Admission: EM | Admit: 2020-10-04 | Discharge: 2020-10-04 | Disposition: A | Discharge status: Home / Self Care | Payer: No Typology Code available for payment source | Attending: Emergency Medicine | Admitting: Emergency Medicine

## 2020-10-04 ENCOUNTER — Ambulatory Visit
Admission: EM | Admit: 2020-10-04 | Discharge: 2020-10-04 | Disposition: A | Discharge status: Home / Self Care | Payer: No Typology Code available for payment source

## 2020-10-04 ENCOUNTER — Emergency Department: Payer: No Typology Code available for payment source

## 2020-10-04 DIAGNOSIS — J029 Acute pharyngitis, unspecified: Secondary | ICD-10-CM

## 2020-10-04 DIAGNOSIS — J039 Acute tonsillitis, unspecified: Secondary | ICD-10-CM | POA: Insufficient documentation

## 2020-10-04 DIAGNOSIS — R1310 Dysphagia, unspecified: Secondary | ICD-10-CM | POA: Diagnosis present

## 2020-10-04 LAB — CBC WITH DIFFERENTIAL/PLATELET
Abs Immature Granulocytes: 0.06 10*3/uL (ref 0.00–0.07)
Basophils Absolute: 0.1 10*3/uL (ref 0.0–0.1)
Basophils Relative: 0 %
Eosinophils Absolute: 0.1 10*3/uL (ref 0.0–0.5)
Eosinophils Relative: 1 %
HCT: 43.3 % (ref 36.0–46.0)
Hemoglobin: 14.6 g/dL (ref 12.0–15.0)
Immature Granulocytes: 1 %
Lymphocytes Relative: 20 %
Lymphs Abs: 2.3 10*3/uL (ref 0.7–4.0)
MCH: 29 pg (ref 26.0–34.0)
MCHC: 33.7 g/dL (ref 30.0–36.0)
MCV: 86.1 fL (ref 80.0–100.0)
Monocytes Absolute: 0.7 10*3/uL (ref 0.1–1.0)
Monocytes Relative: 6 %
Neutro Abs: 8.5 10*3/uL — ABNORMAL HIGH (ref 1.7–7.7)
Neutrophils Relative %: 72 %
Platelets: 196 10*3/uL (ref 150–400)
RBC: 5.03 MIL/uL (ref 3.87–5.11)
RDW: 13.2 % (ref 11.5–15.5)
WBC: 11.8 10*3/uL — ABNORMAL HIGH (ref 4.0–10.5)
nRBC: 0 % (ref 0.0–0.2)

## 2020-10-04 LAB — COMPREHENSIVE METABOLIC PANEL
ALT: 15 U/L (ref 0–44)
AST: 14 U/L — ABNORMAL LOW (ref 15–41)
Albumin: 4 g/dL (ref 3.5–5.0)
Alkaline Phosphatase: 62 U/L (ref 38–126)
Anion gap: 12 (ref 5–15)
BUN: 11 mg/dL (ref 6–20)
CO2: 23 mmol/L (ref 22–32)
Calcium: 9.2 mg/dL (ref 8.9–10.3)
Chloride: 102 mmol/L (ref 98–111)
Creatinine, Ser: 0.71 mg/dL (ref 0.44–1.00)
GFR, Estimated: >60 mL/min (ref >60)
Glucose, Bld: 103 mg/dL — ABNORMAL HIGH (ref 70–99)
Potassium: 4.1 mmol/L (ref 3.5–5.1)
Sodium: 137 mmol/L (ref 135–145)
Total Bilirubin: 0.8 mg/dL (ref 0.3–1.2)
Total Protein: 7.8 g/dL (ref 6.5–8.1)

## 2020-10-04 MED ORDER — PREDNISONE 50 MG PO TABS
50.0000 mg | ORAL_TABLET | Freq: Every day | ORAL | 0 refills | Status: AC
  Filled 2020-10-04: qty 3, 3d supply, fill #0

## 2020-10-04 MED ORDER — PREDNISONE 20 MG PO TABS
60.0000 mg | ORAL_TABLET | Freq: Once | ORAL | Status: AC
  Administered 2020-10-04: 60 mg via ORAL
  Filled 2020-10-04: qty 3

## 2020-10-04 MED ORDER — EPINEPHRINE 0.3 MG/0.3ML IJ SOAJ
0.3000 mg | INTRAMUSCULAR | 1 refills | Status: AC | PRN
  Filled 2020-10-04: qty 2, 2d supply, fill #0

## 2020-10-04 MED ORDER — IOHEXOL 350 MG/ML SOLN
75.0000 mL | Freq: Once | INTRAVENOUS | Status: AC | PRN
  Administered 2020-10-04: 75 mL via INTRAVENOUS

## 2020-10-04 NOTE — ED Triage Notes (Signed)
Pt come with c/o sore throat and trouble swallowing since 1 today. Pt states she went to [facility] but was sent here.  Pt is in NAD at this time
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
## 2020-10-04 NOTE — ED Provider Notes (Signed)
Emergency Medicine Provider Triage Evaluation Note  Connie Proctor , a 35 y.o. female  was evaluated in triage.  Pt complains of sudden onset throat pain and inability to swallow.  Patient states that she had a choking sensation earlier while trying to swallow, thought she may have some postnasal drip and so took some allergy medication.  Patient states that she can forcibly swallow her own spit but otherwise cannot instinctively do so.  Every time she tries to swallow she has a choking/gagging sensation and coughs up the sputum.  Had some bilateral ear pain earlier that has resolved, she denied any true shortness of breath or sore throat prior to the onset of the symptoms.  She still has no shortness of breath or difficulty breathing cannot swallow without forcibly doing so.  She does note some edema of her anterior neck.  No fevers or chills, nasal congestion, cough, shortness of breath, chest pain, abdominal pain, nausea or vomiting, diarrhea or constipation.  No history of same in the past.  Patient does note that prior to arrival to help with symptoms but has not had any other medications.  Review of Systems  Positive: Sore throat, difficulty swallowing Negative: Fevers or chills, nasal congestion, cough, chest pain, abdominal pain  Physical Exam  BP (!) 186/98   Pulse 97   Temp 98 F (36.7 C)   Resp 18   SpO2 100%  Gen:   Awake, no distress   Resp:  Normal effort  MSK:   Moves extremities without difficulty  Other:  Visualization of the anterior neck reveals slight edema to the neck mostly right than left.  There is no open wounds.  Area is mildly tender on the right side to palpation.  No palpable underlying abnormality.  No edema or erythema of the submandibular space.  Trachea still midline.  Medical Decision Making  Medically screening exam initiated at 6:29 PM.  Appropriate orders placed.  Connie Proctor was informed that the remainder of the evaluation will be completed by  another provider, this initial triage assessment does not replace that evaluation, and the importance of remaining in the ED until their evaluation is complete.  Patient arrives with sudden onset sore throat difficulty swallowing.  Patient does have some slight edema and tenderness on exam.  Patient will have labs, CT scan at this time for further evaluation   Brynda Peon 10/04/20 Luci Bank, MD 10/04/20 [phone]

## 2020-10-05 ENCOUNTER — Other Ambulatory Visit: Payer: Self-pay

## 2020-10-08 ENCOUNTER — Telehealth: Payer: No Typology Code available for payment source | Admitting: Nurse Practitioner

## 2020-10-08 DIAGNOSIS — J029 Acute pharyngitis, unspecified: Secondary | ICD-10-CM | POA: Diagnosis not present

## 2020-10-08 MED ORDER — AMOXICILLIN 500 MG PO CAPS: 500.0000 mg | ORAL_CAPSULE | Freq: Two times a day (BID) | ORAL | 0 refills | Status: AC

## 2020-10-08 NOTE — Progress Notes (Signed)
E-Visit for Sore Throat -   We are sorry that you are not feeling well.  Here is how we plan to help!  Based on what you have shared with me it is likely that you have pharyngitis being caused by bacterial. Pharyngitis is inflammation and infection in the back of the throat.  This is an infection cause by bacteria and is treated with antibiotics.  I have prescribed Amoxicillin 500 mg twice a day for 10 days. For throat pain, we recommend over the counter oral pain relief medications such as acetaminophen or aspirin, or anti-inflammatory medications such as ibuprofen or naproxen sodium. Topical treatments such as oral throat lozenges or sprays may be used as needed. Strep infections are not as easily transmitted as other respiratory infections, however we still recommend that you avoid close contact with loved ones, especially the very young and elderly.  Remember to wash your hands thoroughly throughout the day as this is the number one way to prevent the spread of infection and wipe down door knobs and counters with disinfectant.   Home Care: Only take medications as instructed by your medical team. Complete the entire course of an antibiotic. Do not take these medications with alcohol. A steam or ultrasonic humidifier can help congestion.  You can place a towel over your head and breathe in the steam from hot water coming from a faucet. Avoid close contacts especially the very young and the elderly. Cover your mouth when you cough or sneeze. Always remember to wash your hands.  Get Help Right Away If: You develop worsening fever or sinus pain. You develop a severe head ache or visual changes. Your symptoms persist after you have completed your treatment plan.  Make sure you Understand these instructions. Will watch your condition. Will get help right away if you are not doing well or get worse.   Thank you for choosing an e-visit.  Your e-visit answers were reviewed by a board  certified advanced clinical practitioner to complete your personal care plan. Depending upon the condition, your plan could have included both over the counter or prescription medications.  Please review your pharmacy choice. Make sure the pharmacy is open so you can pick up prescription now. If there is a problem, you may contact your provider through [employer] and have the prescription routed to another pharmacy.  Your safety is important to [place]. If you have drug allergies check your prescription carefully.   For the next 24 hours you can use MyChart to ask questions about today's visit, request a non-urgent call back, or ask for a work or school excuse. You will get an email in the next two days asking about your experience. I hope that your e-visit has been valuable and will speed your recovery.  I have spent at least 5 minutes reviewing and documenting in the patient's chart.

## 2020-10-09 ENCOUNTER — Other Ambulatory Visit: Payer: Self-pay

## 2020-10-09 MED ORDER — PREDNISONE 10 MG PO TABS
ORAL_TABLET | ORAL | 0 refills | Status: AC
  Filled 2020-10-09: qty 30, 12d supply, fill #0

## 2020-10-12 ENCOUNTER — Other Ambulatory Visit: Payer: Self-pay

## 2020-10-12 MED ORDER — FLUCONAZOLE 150 MG PO TABS
150.0000 mg | ORAL_TABLET | Freq: Once | ORAL | 0 refills | Status: AC
  Filled 2020-10-12: qty 1, 1d supply, fill #0

## 2020-10-25 ENCOUNTER — Other Ambulatory Visit: Payer: Self-pay

## 2020-10-25 MED ORDER — FLUCONAZOLE 150 MG PO TABS
150.0000 mg | ORAL_TABLET | Freq: Once | ORAL | 0 refills | Status: AC
  Filled 2020-10-25: qty 1, 1d supply, fill #0

## 2020-10-25 MED ORDER — LEVOFLOXACIN 500 MG PO TABS
ORAL_TABLET | ORAL | 0 refills | Status: DC
  Filled 2020-10-25: qty 7, 7d supply, fill #0

## 2020-12-12 ENCOUNTER — Other Ambulatory Visit: Payer: Self-pay

## 2021-01-30 ENCOUNTER — Ambulatory Visit
Admission: RE | Admit: 2021-01-30 | Discharge: 2021-01-30 | Disposition: A | Discharge status: Home / Self Care | Payer: No Typology Code available for payment source | Source: Ambulatory Visit | Attending: Family Medicine | Admitting: Family Medicine

## 2021-01-30 ENCOUNTER — Other Ambulatory Visit: Payer: Self-pay

## 2021-01-30 ENCOUNTER — Other Ambulatory Visit: Payer: Self-pay | Admitting: Physician Assistant

## 2021-01-30 ENCOUNTER — Other Ambulatory Visit: Payer: Self-pay | Admitting: Family Medicine

## 2021-01-30 DIAGNOSIS — N6310 Unspecified lump in the right breast, unspecified quadrant: Secondary | ICD-10-CM

## 2021-02-16 ENCOUNTER — Other Ambulatory Visit: Payer: Self-pay

## 2021-03-06 ENCOUNTER — Other Ambulatory Visit: Payer: Self-pay

## 2021-03-08 ENCOUNTER — Encounter: Payer: No Typology Code available for payment source | Admitting: Obstetrics and Gynecology

## 2021-03-23 ENCOUNTER — Other Ambulatory Visit: Payer: Self-pay

## 2021-03-26 ENCOUNTER — Other Ambulatory Visit: Payer: Self-pay

## 2021-03-27 ENCOUNTER — Other Ambulatory Visit: Payer: Self-pay

## 2021-03-27 MED ORDER — LEVOTHYROXINE SODIUM 125 MCG PO TABS
ORAL_TABLET | ORAL | 1 refills | Status: DC
  Filled 2021-03-27: qty 90, 90d supply, fill #0
  Filled 2021-11-11 – 2022-03-08 (×2): qty 90, 90d supply, fill #1
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
## 2021-04-10 ENCOUNTER — Encounter: Payer: Self-pay | Admitting: Obstetrics and Gynecology

## 2021-04-10 ENCOUNTER — Ambulatory Visit (INDEPENDENT_AMBULATORY_CARE_PROVIDER_SITE_OTHER): Payer: No Typology Code available for payment source | Admitting: Obstetrics and Gynecology

## 2021-04-10 ENCOUNTER — Other Ambulatory Visit (HOSPITAL_COMMUNITY)
Admission: RE | Admit: 2021-04-10 | Discharge: 2021-04-10 | Disposition: A | Discharge status: Home / Self Care | Payer: No Typology Code available for payment source | Source: Ambulatory Visit | Attending: Obstetrics and Gynecology | Admitting: Obstetrics and Gynecology

## 2021-04-10 VITALS — BP 126/92 | HR 74 | Resp 16 | Ht 64.0 in | Wt 162.5 lb

## 2021-04-10 DIAGNOSIS — Z124 Encounter for screening for malignant neoplasm of cervix: Secondary | ICD-10-CM | POA: Insufficient documentation

## 2021-04-10 DIAGNOSIS — N809 Endometriosis, unspecified: Secondary | ICD-10-CM | POA: Diagnosis not present

## 2021-04-10 DIAGNOSIS — Z01419 Encounter for gynecological examination (general) (routine) without abnormal findings: Secondary | ICD-10-CM | POA: Insufficient documentation

## 2021-04-10 DIAGNOSIS — E890 Postprocedural hypothyroidism: Secondary | ICD-10-CM | POA: Diagnosis not present

## 2021-04-10 DIAGNOSIS — E663 Overweight: Secondary | ICD-10-CM

## 2021-04-10 NOTE — Patient Instructions (Signed)
Preventive Care 37-37 Years Old, Female ?Preventive care refers to lifestyle choices and visits with your health care provider that can promote health and wellness. Preventive care visits are also called wellness exams. ?What can I expect for my preventive care visit? ?Counseling ?During your preventive care visit, your health care provider may ask about your: ?Medical history, including: ?Past medical problems. ?Family medical history. ?Pregnancy history. ?Current health, including: ?Menstrual cycle. ?Method of birth control. ?Emotional well-being. ?Home life and relationship well-being. ?Sexual activity and sexual health. ?Lifestyle, including: ?Alcohol, nicotine or tobacco, and drug use. ?Access to firearms. ?Diet, exercise, and sleep habits. ?Work and work Statistician. ?Sunscreen use. ?Safety issues such as seatbelt and bike helmet use. ?Physical exam ?Your health care provider may check your: ?Height and weight. These may be used to calculate your BMI (body mass index). BMI is a measurement that tells if you are at a healthy weight. ?Waist circumference. This measures the distance around your waistline. This measurement also tells if you are at a healthy weight and may help predict your risk of certain diseases, such as type 2 diabetes and high blood pressure. ?Heart rate and blood pressure. ?Body temperature. ?Skin for abnormal spots. ?What immunizations do I need? ?Vaccines are usually given at various ages, according to a schedule. Your health care provider will recommend vaccines for you based on your age, medical history, and lifestyle or other factors, such as travel or where you work. ?What tests do I need? ?Screening ?Your health care provider may recommend screening tests for certain conditions. This may include: ?Pelvic exam and Pap test. ?Lipid and cholesterol levels. ?Diabetes screening. This is done by checking your blood sugar (glucose) after you have not eaten for a while (fasting). ?Hepatitis B  test. ?Hepatitis C test. ?HIV (human immunodeficiency virus) test. ?STI (sexually transmitted infection) testing, if you are at risk. ?BRCA-related cancer screening. This may be done if you have a family history of breast, ovarian, tubal, or peritoneal cancers. ?Talk with your health care provider about your test results, treatment options, and if necessary, the need for more tests. ?Follow these instructions at home: ?Eating and drinking ? ?Eat a healthy diet that includes fresh fruits and vegetables, whole grains, lean protein, and low-fat dairy products. ?Take vitamin and mineral supplements as recommended by your health care provider. ?Do not drink alcohol if: ?Your health care provider tells you not to drink. ?You are pregnant, may be pregnant, or are planning to become pregnant. ?If you drink alcohol: ?Limit how much you have to 0-1 drink a day. ?Know how much alcohol is in your drink. In the [place] one drink equals one 12 oz bottle of beer (355 mL), one 5 oz glass of wine (148 mL), or one 1? oz glass of hard liquor (44 mL). ?Lifestyle ?Brush your teeth every morning and night with fluoride toothpaste. Floss one time each day. ?Exercise for at least 30 minutes 5 or more days each week. ?Do not use any products that contain nicotine or tobacco. These products include cigarettes, chewing tobacco, and vaping devices, such as e-cigarettes. If you need help quitting, ask your health care provider. ?Do not use drugs. ?If you are sexually active, practice safe sex. Use a condom or other form of protection to prevent STIs. ?If you do not wish to become pregnant, use a form of birth control. If you plan to become pregnant, see your health care provider for a prepregnancy visit. ?Find healthy ways to manage stress, such as: ?Meditation, yoga,  or listening to music. ?Journaling. ?Talking to a trusted person. ?Spending time with friends and family. ?Minimize exposure to UV radiation to reduce your risk of skin  cancer. ?Safety ?Always wear your seat belt while driving or riding in a vehicle. ?Do not drive: ?If you have been drinking alcohol. Do not ride with someone who has been drinking. ?If you have been using any mind-altering substances or drugs. ?While texting. ?When you are tired or distracted. ?Wear a helmet and other protective equipment during sports activities. ?If you have firearms in your house, make sure you follow all gun safety procedures. ?Seek help if you have been physically or sexually abused. ?What's next? ?Go to your health care provider once a year for an annual wellness visit. ?Ask your health care provider how often you should have your eyes and teeth checked. ?Stay up to date on all vaccines. ?This information is not intended to replace advice given to you by your health care provider. Make sure you discuss any questions you have with your health care provider. ?Document Revised: 06/21/2020 Document Reviewed: 06/21/2020 ?Elsevier Patient Education ? [place]. ? ?

## 2021-04-12 LAB — CYTOLOGY - PAP
Comment: NEGATIVE
Diagnosis: NEGATIVE
High risk HPV: NEGATIVE

## 2021-05-03 ENCOUNTER — Telehealth: Payer: No Typology Code available for payment source | Admitting: Physician Assistant

## 2021-05-03 DIAGNOSIS — B9689 Other specified bacterial agents as the cause of diseases classified elsewhere: Secondary | ICD-10-CM | POA: Diagnosis not present

## 2021-05-03 DIAGNOSIS — J019 Acute sinusitis, unspecified: Secondary | ICD-10-CM | POA: Diagnosis not present

## 2021-05-03 MED ORDER — AMOXICILLIN-POT CLAVULANATE 875-125 MG PO TABS: 1.0000 | ORAL_TABLET | Freq: Two times a day (BID) | ORAL | 0 refills | Status: DC

## 2021-05-03 NOTE — Progress Notes (Signed)

## 2021-05-03 NOTE — Progress Notes (Signed)
I have spent 5 minutes in review of e-visit questionnaire, review and updating patient chart, medical decision making and response to patient.   Ikesha Siller Cody Chloey Ricard, PA-C    

## 2021-05-24 ENCOUNTER — Other Ambulatory Visit: Payer: Self-pay | Admitting: Obstetrics and Gynecology

## 2021-05-24 ENCOUNTER — Telehealth: Payer: Self-pay | Admitting: Urgent Care

## 2021-05-24 DIAGNOSIS — J0101 Acute recurrent maxillary sinusitis: Secondary | ICD-10-CM

## 2021-05-24 MED ORDER — LEVOFLOXACIN 500 MG PO TABS
500.0000 mg | ORAL_TABLET | Freq: Every day | ORAL | 0 refills | Status: AC
  Filled 2021-05-24: qty 7, 7d supply, fill #0

## 2021-05-24 NOTE — Progress Notes (Signed)
E-Visit for Sinus Problems  We are sorry that you are not feeling well.  Here is how we plan to help!  Based on what you have shared with me it looks like you have sinusitis.  Sinusitis is inflammation and infection in the sinus cavities of the head.  Based on your presentation I believe you most likely have Acute Bacterial Sinusitis.  This is an infection caused by bacteria and is treated with antibiotics. I have prescribed Levofloxicin '500mg'$  by mouth once daily for 7 days. You may use an oral decongestant such as Mucinex D or if you have glaucoma or high blood pressure use plain Mucinex. Saline nasal spray help and can safely be used as often as needed for congestion.  If you develop worsening sinus pain, fever or notice severe headache and vision changes, or if symptoms are not better after completion of antibiotic, please schedule an appointment with a health care provider.    Sinus infections are not as easily transmitted as other respiratory infection, however we still recommend that you avoid close contact with loved ones, especially the very young and elderly.  Remember to wash your hands thoroughly throughout the day as this is the number one way to prevent the spread of infection!  It appears you were seen for the same symptoms on 05/03/21, and at that point stated it was a recurrence of similar symptoms the week or two prior. If this is your third bout of the same infection, I would recommend being seen at an in person urgent care if the treatment offered today does not clear it completely. You may need additional testing.  Home Care: Only take medications as instructed by your medical team. Complete the entire course of an antibiotic. Do not take these medications with alcohol. A steam or ultrasonic humidifier can help congestion.  You can place a towel over your head and breathe in the steam from hot water coming from a faucet. Avoid close contacts especially the very young and the  elderly. Cover your mouth when you cough or sneeze. Always remember to wash your hands.  Get Help Right Away If: You develop worsening fever or sinus pain. You develop a severe head ache or visual changes. Your symptoms persist after you have completed your treatment plan.  Make sure you Understand these instructions. Will watch your condition. Will get help right away if you are not doing well or get worse.  Thank you for choosing an e-visit.  Your e-visit answers were reviewed by a board certified advanced clinical practitioner to complete your personal care plan. Depending upon the condition, your plan could have included both over the counter or prescription medications.  Please review your pharmacy choice. Make sure the pharmacy is open so you can pick up prescription now. If there is a problem, you may contact your provider through [employer] and have the prescription routed to another pharmacy.  Your safety is important to [place]. If you have drug allergies check your prescription carefully.   For the next 24 hours you can use MyChart to ask questions about today's visit, request a non-urgent call back, or ask for a work or school excuse. You will get an email in the next two days asking about your experience. I hope that your e-visit has been valuable and will speed your recovery.   I have spent 5 minutes in review of e-visit questionnaire, review and updating patient chart, medical decision making and response to patient.   Bobbe Quilter L  [place], [place]

## 2021-05-25 ENCOUNTER — Other Ambulatory Visit: Payer: Self-pay

## 2021-05-25 MED ORDER — DROSPIRENONE-ETHINYL ESTRADIOL 3-0.02 MG PO TABS
1.0000 | ORAL_TABLET | Freq: Every day | ORAL | 3 refills | Status: DC
  Filled 2021-05-25: qty 84, 84d supply, fill #0
  Filled 2021-08-19: qty 84, 84d supply, fill #1
  Filled 2021-11-11: qty 84, 84d supply, fill #2
  Filled 2022-02-04: qty 84, 84d supply, fill #3

## 2021-06-19 ENCOUNTER — Other Ambulatory Visit: Payer: Self-pay

## 2021-06-19 MED ORDER — LEVOTHYROXINE SODIUM 125 MCG PO TABS
ORAL_TABLET | ORAL | 1 refills | Status: DC
  Filled 2021-06-19: qty 90, 90d supply, fill #0
  Filled 2021-09-07: qty 90, 90d supply, fill #1

## 2021-06-20 ENCOUNTER — Other Ambulatory Visit: Payer: Self-pay

## 2021-06-21 ENCOUNTER — Other Ambulatory Visit: Payer: Self-pay

## 2021-08-20 ENCOUNTER — Other Ambulatory Visit: Payer: Self-pay

## 2021-09-07 ENCOUNTER — Other Ambulatory Visit: Payer: Self-pay

## 2021-11-11 ENCOUNTER — Other Ambulatory Visit: Payer: Self-pay

## 2021-11-12 ENCOUNTER — Other Ambulatory Visit: Payer: Self-pay

## 2021-12-14 ENCOUNTER — Other Ambulatory Visit: Payer: Self-pay

## 2021-12-14 MED ORDER — LEVOTHYROXINE SODIUM 125 MCG PO TABS
125.0000 ug | ORAL_TABLET | Freq: Every morning | ORAL | 0 refills | Status: DC
  Filled 2021-12-14: qty 90, 90d supply, fill #0

## 2022-01-03 ENCOUNTER — Telehealth: Payer: No Typology Code available for payment source | Admitting: Physician Assistant

## 2022-01-03 ENCOUNTER — Other Ambulatory Visit: Payer: Self-pay

## 2022-01-03 DIAGNOSIS — J019 Acute sinusitis, unspecified: Secondary | ICD-10-CM | POA: Diagnosis not present

## 2022-01-03 DIAGNOSIS — B9689 Other specified bacterial agents as the cause of diseases classified elsewhere: Secondary | ICD-10-CM | POA: Diagnosis not present

## 2022-01-03 MED ORDER — AMOXICILLIN-POT CLAVULANATE 875-125 MG PO TABS
1.0000 | ORAL_TABLET | Freq: Two times a day (BID) | ORAL | 0 refills | Status: DC
  Filled 2022-01-03: qty 14, 7d supply, fill #0

## 2022-01-03 NOTE — Progress Notes (Signed)

## 2022-01-03 NOTE — Progress Notes (Signed)
I have spent 5 minutes in review of e-visit questionnaire, review and updating patient chart, medical decision making and response to patient.   Trevonn Hallum Cody Phynix Horton, PA-C    

## 2022-01-31 ENCOUNTER — Other Ambulatory Visit: Payer: Self-pay

## 2022-01-31 DIAGNOSIS — R1013 Epigastric pain: Secondary | ICD-10-CM | POA: Diagnosis not present

## 2022-01-31 MED ORDER — OMEPRAZOLE 40 MG PO CPDR
40.0000 mg | DELAYED_RELEASE_CAPSULE | Freq: Two times a day (BID) | ORAL | 2 refills | Status: DC
  Filled 2022-01-31: qty 45, 22d supply, fill #0
  Filled 2022-01-31 (×2): qty 15, 8d supply, fill #0
  Filled 2022-08-14: qty 60, 30d supply, fill #1
  Filled 2022-11-06: qty 60, 30d supply, fill #2

## 2022-02-05 ENCOUNTER — Other Ambulatory Visit: Payer: Self-pay

## 2022-03-08 ENCOUNTER — Other Ambulatory Visit: Payer: Self-pay

## 2022-03-27 ENCOUNTER — Ambulatory Visit
Admission: RE | Admit: 2022-03-27 | Discharge: 2022-03-27 | Disposition: A | Discharge status: Home / Self Care | Payer: 59 | Source: Ambulatory Visit | Attending: Physician Assistant | Admitting: Physician Assistant

## 2022-03-27 ENCOUNTER — Other Ambulatory Visit: Payer: Self-pay | Admitting: Physician Assistant

## 2022-03-27 DIAGNOSIS — M79604 Pain in right leg: Secondary | ICD-10-CM | POA: Diagnosis not present

## 2022-03-27 DIAGNOSIS — M7661 Achilles tendinitis, right leg: Secondary | ICD-10-CM | POA: Diagnosis not present

## 2022-03-27 DIAGNOSIS — M25471 Effusion, right ankle: Secondary | ICD-10-CM | POA: Diagnosis not present

## 2022-03-27 DIAGNOSIS — M25571 Pain in right ankle and joints of right foot: Secondary | ICD-10-CM | POA: Diagnosis not present

## 2022-03-27 DIAGNOSIS — R600 Localized edema: Secondary | ICD-10-CM | POA: Diagnosis not present

## 2022-03-27 DIAGNOSIS — M7989 Other specified soft tissue disorders: Secondary | ICD-10-CM | POA: Diagnosis not present

## 2022-04-08 DIAGNOSIS — M25571 Pain in right ankle and joints of right foot: Secondary | ICD-10-CM | POA: Diagnosis not present

## 2022-04-14 ENCOUNTER — Other Ambulatory Visit: Payer: Self-pay | Admitting: Obstetrics and Gynecology

## 2022-04-14 ENCOUNTER — Other Ambulatory Visit: Payer: Self-pay

## 2022-04-15 ENCOUNTER — Other Ambulatory Visit: Payer: Self-pay

## 2022-04-15 ENCOUNTER — Other Ambulatory Visit: Payer: Self-pay | Admitting: Obstetrics and Gynecology

## 2022-04-15 MED ORDER — LEVOTHYROXINE SODIUM 125 MCG PO TABS
125.0000 ug | ORAL_TABLET | Freq: Every morning | ORAL | 0 refills | Status: DC
  Filled 2022-04-15 – 2022-05-27 (×4): qty 90, 90d supply, fill #0

## 2022-04-15 MED FILL — Drospirenone-Ethinyl Estradiol Tab 3-0.02 MG: ORAL | 84 days supply | Qty: 84 | Fill #0 | Status: CN
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
## 2022-04-17 ENCOUNTER — Encounter: Payer: Self-pay | Admitting: Obstetrics and Gynecology

## 2022-04-17 ENCOUNTER — Ambulatory Visit (INDEPENDENT_AMBULATORY_CARE_PROVIDER_SITE_OTHER): Payer: 59 | Admitting: Obstetrics and Gynecology

## 2022-04-17 VITALS — BP 114/81 | HR 86 | Resp 16 | Ht 64.0 in | Wt 170.7 lb

## 2022-04-17 DIAGNOSIS — N809 Endometriosis, unspecified: Secondary | ICD-10-CM

## 2022-04-17 DIAGNOSIS — Z01419 Encounter for gynecological examination (general) (routine) without abnormal findings: Secondary | ICD-10-CM | POA: Diagnosis not present

## 2022-04-17 DIAGNOSIS — Z3041 Encounter for surveillance of contraceptive pills: Secondary | ICD-10-CM

## 2022-04-17 DIAGNOSIS — E663 Overweight: Secondary | ICD-10-CM

## 2022-04-17 DIAGNOSIS — E890 Postprocedural hypothyroidism: Secondary | ICD-10-CM

## 2022-04-17 MED ORDER — DROSPIRENONE-ETHINYL ESTRADIOL 3-0.02 MG PO TABS
1.0000 | ORAL_TABLET | Freq: Every day | ORAL | 3 refills | Status: DC
  Filled 2022-04-17 – 2022-05-23 (×4): qty 84, 84d supply, fill #0
  Filled 2022-08-14: qty 84, 84d supply, fill #1
  Filled 2022-11-06: qty 84, 84d supply, fill #2
  Filled 2023-02-03: qty 84, 84d supply, fill #3

## 2022-04-17 NOTE — Patient Instructions (Signed)
Preventive Care 21-39 Years Old, Female Preventive care refers to lifestyle choices and visits with your health care provider that can promote health and wellness. Preventive care visits are also called wellness exams. What can I expect for my preventive care visit? Counseling During your preventive care visit, your health care provider may ask about your: Medical history, including: Past medical problems. Family medical history. Pregnancy history. Current health, including: Menstrual cycle. Method of birth control. Emotional well-being. Home life and relationship well-being. Sexual activity and sexual health. Lifestyle, including: Alcohol, nicotine or tobacco, and drug use. Access to firearms. Diet, exercise, and sleep habits. Work and work environment. Sunscreen use. Safety issues such as seatbelt and bike helmet use. Physical exam Your health care provider may check your: Height and weight. These may be used to calculate your BMI (body mass index). BMI is a measurement that tells if you are at a healthy weight. Waist circumference. This measures the distance around your waistline. This measurement also tells if you are at a healthy weight and may help predict your risk of certain diseases, such as type 2 diabetes and high blood pressure. Heart rate and blood pressure. Body temperature. Skin for abnormal spots. What immunizations do I need?  Vaccines are usually given at various ages, according to a schedule. Your health care provider will recommend vaccines for you based on your age, medical history, and lifestyle or other factors, such as travel or where you work. What tests do I need? Screening Your health care provider may recommend screening tests for certain conditions. This may include: Pelvic exam and Pap test. Lipid and cholesterol levels. Diabetes screening. This is done by checking your blood sugar (glucose) after you have not eaten for a while (fasting). Hepatitis  B test. Hepatitis C test. HIV (human immunodeficiency virus) test. STI (sexually transmitted infection) testing, if you are at risk. BRCA-related cancer screening. This may be done if you have a family history of breast, ovarian, tubal, or peritoneal cancers. Talk with your health care provider about your test results, treatment options, and if necessary, the need for more tests. Follow these instructions at home: Eating and drinking  Eat a healthy diet that includes fresh fruits and vegetables, whole grains, lean protein, and low-fat dairy products. Take vitamin and mineral supplements as recommended by your health care provider. Do not drink alcohol if: Your health care provider tells you not to drink. You are pregnant, may be pregnant, or are planning to become pregnant. If you drink alcohol: Limit how much you have to 0-1 drink a day. Know how much alcohol is in your drink. In the [place] one drink equals one 12 oz bottle of beer (355 mL), one 5 oz glass of wine (148 mL), or one 1 oz glass of hard liquor (44 mL). Lifestyle Brush your teeth every morning and night with fluoride toothpaste. Floss one time each day. Exercise for at least 30 minutes 5 or more days each week. Do not use any products that contain nicotine or tobacco. These products include cigarettes, chewing tobacco, and vaping devices, such as e-cigarettes. If you need help quitting, ask your health care provider. Do not use drugs. If you are sexually active, practice safe sex. Use a condom or other form of protection to prevent STIs. If you do not wish to become pregnant, use a form of birth control. If you plan to become pregnant, see your health care provider for a prepregnancy visit. Find healthy ways to manage stress, such as: Meditation,   yoga, or listening to music. Journaling. Talking to a trusted person. Spending time with friends and family. Minimize exposure to UV radiation to reduce your risk of skin  cancer. Safety Always wear your seat belt while driving or riding in a vehicle. Do not drive: If you have been drinking alcohol. Do not ride with someone who has been drinking. If you have been using any mind-altering substances or drugs. While texting. When you are tired or distracted. Wear a helmet and other protective equipment during sports activities. If you have firearms in your house, make sure you follow all gun safety procedures. Seek help if you have been physically or sexually abused. What's next? Go to your health care provider once a year for an annual wellness visit. Ask your health care provider how often you should have your eyes and teeth checked. Stay up to date on all vaccines. This information is not intended to replace advice given to you by your health care provider. Make sure you discuss any questions you have with your health care provider. Document Revised: 06/21/2020 Document Reviewed: 06/21/2020 Elsevier Patient Education  2023 Elsevier Inc. Breast Self-Awareness Breast self-awareness is knowing how your breasts look and feel. You need to: Check your breasts on a regular basis. Tell your doctor about any changes. Become familiar with the look and feel of your breasts. This can help you catch a breast problem while it is still small and can be treated. You should do breast self-exams even if you have breast implants. What you need: A mirror. A well-lit room. A pillow or other soft object. How to do a breast self-exam Follow these steps to do a breast self-exam: Look for changes  Take off all the clothes above your waist. Stand in front of a mirror in a room with good lighting. Put your hands down at your sides. Compare your breasts in the mirror. Look for any difference between them, such as: A difference in shape. A difference in size. Wrinkles, dips, and bumps in one breast and not the other. Look at each breast for changes in the skin, such  as: Redness. Scaly areas. Skin that has gotten thicker. Dimpling. Open sores (ulcers). Look for changes in your nipples, such as: Fluid coming out of a nipple. Fluid around a nipple. Bleeding. Dimpling. Redness. A nipple that looks pushed in (retracted), or that has changed position. Feel for changes Lie on your back. Feel each breast. To do this: Pick a breast to feel. Place a pillow under the shoulder closest to that breast. Put the arm closest to that breast behind your head. Feel the nipple area of that breast using the hand of your other arm. Feel the area with the pads of your three middle fingers by making small circles with your fingers. Use light, medium, and firm pressure. Continue the overlapping circles, moving downward over the breast. Keep making circles with your fingers. Stop when you feel your ribs. Start making circles with your fingers again, this time going upward until you reach your collarbone. Then, make circles outward across your breast and into your armpit area. Squeeze your nipple. Check for discharge and lumps. Repeat these steps to check your other breast. Sit or stand in the tub or shower. With soapy water on your skin, feel each breast the same way you did when you were lying down. Write down what you find Writing down what you find can help you remember what to tell your doctor. Write down: What is   normal for each breast. Any changes you find in each breast. These include: The kind of changes you find. A tender or painful breast. Any lump you find. Write down its size and where it is. When you last had your monthly period (menstrual cycle). General tips If you are breastfeeding, the best time to check your breasts is after you feed your baby or after you use a breast pump. If you get monthly bleeding, the best time to check your breasts is 5-7 days after your monthly cycle ends. With time, you will become comfortable with the self-exam. You will  also start to know if there are changes in your breasts. Contact a doctor if: You see a change in the shape or size of your breasts or nipples. You see a change in the skin of your breast or nipples, such as red or scaly skin. You have fluid coming from your nipples that is not normal. You find a new lump or thick area. You have breast pain. You have any concerns about your breast health. Summary Breast self-awareness includes looking for changes in your breasts and feeling for changes within your breasts. You should do breast self-awareness in front of a mirror in a well-lit room. If you get monthly periods (menstrual cycles), the best time to check your breasts is 5-7 days after your period ends. Tell your doctor about any changes you see in your breasts. Changes include changes in size, changes on the skin, painful or tender breasts, or fluid from your nipples that is not normal. This information is not intended to replace advice given to you by your health care provider. Make sure you discuss any questions you have with your health care provider. Document Revised: 05/31/2021 Document Reviewed: 10/26/2020 Elsevier Patient Education  2023 Elsevier Inc.  

## 2022-04-18 ENCOUNTER — Other Ambulatory Visit: Payer: Self-pay

## 2022-04-18 ENCOUNTER — Encounter: Payer: Self-pay | Admitting: Obstetrics and Gynecology

## 2022-04-24 ENCOUNTER — Other Ambulatory Visit: Payer: Self-pay

## 2022-04-24 MED ORDER — DROSPIRENONE-ETHINYL ESTRADIOL 3-0.02 MG PO TABS: 1.0000 | ORAL_TABLET | Freq: Every day | ORAL | 11 refills | Status: DC

## 2022-04-26 ENCOUNTER — Other Ambulatory Visit: Payer: Self-pay

## 2022-04-26 NOTE — Telephone Encounter (Signed)
Yas is the brand name. [facility] will send over a PA.

## 2022-05-06 NOTE — Telephone Encounter (Signed)
Ok.  Let's complete one for her birth control if they haven't sent one as of yet.

## 2022-05-09 ENCOUNTER — Ambulatory Visit
Admission: RE | Admit: 2022-05-09 | Discharge: 2022-05-09 | Disposition: A | Discharge status: Home / Self Care | Payer: 59 | Source: Ambulatory Visit | Attending: Family Medicine | Admitting: Family Medicine

## 2022-05-09 ENCOUNTER — Other Ambulatory Visit: Payer: Self-pay | Admitting: Family Medicine

## 2022-05-09 DIAGNOSIS — Z1231 Encounter for screening mammogram for malignant neoplasm of breast: Secondary | ICD-10-CM

## 2022-05-23 ENCOUNTER — Other Ambulatory Visit: Payer: Self-pay

## 2022-05-24 ENCOUNTER — Other Ambulatory Visit: Payer: Self-pay

## 2022-05-27 ENCOUNTER — Other Ambulatory Visit: Payer: Self-pay

## 2022-05-28 ENCOUNTER — Encounter: Payer: Self-pay | Admitting: Obstetrics and Gynecology

## 2022-06-01 ENCOUNTER — Telehealth: Payer: 59 | Admitting: Family

## 2022-06-01 DIAGNOSIS — J019 Acute sinusitis, unspecified: Secondary | ICD-10-CM

## 2022-06-01 MED ORDER — AMOXICILLIN-POT CLAVULANATE 875-125 MG PO TABS: 1.0000 | ORAL_TABLET | Freq: Two times a day (BID) | ORAL | 0 refills | Status: DC

## 2022-06-01 NOTE — Progress Notes (Signed)

## 2022-07-08 DIAGNOSIS — K219 Gastro-esophageal reflux disease without esophagitis: Secondary | ICD-10-CM | POA: Diagnosis not present

## 2022-07-08 DIAGNOSIS — Z136 Encounter for screening for cardiovascular disorders: Secondary | ICD-10-CM | POA: Diagnosis not present

## 2022-07-08 DIAGNOSIS — Z131 Encounter for screening for diabetes mellitus: Secondary | ICD-10-CM | POA: Diagnosis not present

## 2022-07-08 DIAGNOSIS — E079 Disorder of thyroid, unspecified: Secondary | ICD-10-CM | POA: Diagnosis not present

## 2022-07-08 DIAGNOSIS — F411 Generalized anxiety disorder: Secondary | ICD-10-CM | POA: Diagnosis not present

## 2022-07-08 DIAGNOSIS — Z0000 Encounter for general adult medical examination without abnormal findings: Secondary | ICD-10-CM | POA: Diagnosis not present

## 2022-07-15 DIAGNOSIS — R030 Elevated blood-pressure reading, without diagnosis of hypertension: Secondary | ICD-10-CM | POA: Diagnosis not present

## 2022-07-15 DIAGNOSIS — E079 Disorder of thyroid, unspecified: Secondary | ICD-10-CM | POA: Diagnosis not present

## 2022-07-15 DIAGNOSIS — Z0000 Encounter for general adult medical examination without abnormal findings: Secondary | ICD-10-CM | POA: Diagnosis not present

## 2022-07-15 DIAGNOSIS — Z23 Encounter for immunization: Secondary | ICD-10-CM | POA: Diagnosis not present

## 2022-08-15 ENCOUNTER — Other Ambulatory Visit: Payer: Self-pay

## 2022-08-15 MED ORDER — LEVOTHYROXINE SODIUM 125 MCG PO TABS
125.0000 ug | ORAL_TABLET | Freq: Every day | ORAL | 1 refills | Status: DC
  Filled 2022-08-15: qty 90, 90d supply, fill #0
  Filled 2022-08-16 – 2022-11-06 (×2): qty 90, 90d supply, fill #1

## 2022-08-16 ENCOUNTER — Other Ambulatory Visit: Payer: Self-pay

## 2022-10-26 ENCOUNTER — Ambulatory Visit
Admission: EM | Admit: 2022-10-26 | Discharge: 2022-10-26 | Disposition: A | Discharge status: Home / Self Care | Payer: 59 | Attending: Physician Assistant | Admitting: Physician Assistant

## 2022-10-26 DIAGNOSIS — R051 Acute cough: Secondary | ICD-10-CM | POA: Diagnosis not present

## 2022-10-26 DIAGNOSIS — J069 Acute upper respiratory infection, unspecified: Secondary | ICD-10-CM

## 2022-10-26 MED ORDER — PROMETHAZINE-DM 6.25-15 MG/5ML PO SYRP: 5.0000 mL | ORAL_SOLUTION | Freq: Four times a day (QID) | ORAL | 0 refills | Status: DC | PRN

## 2022-10-26 MED ORDER — PREDNISONE 20 MG PO TABS: 40.0000 mg | ORAL_TABLET | Freq: Every day | ORAL | 0 refills | Status: AC

## 2022-10-26 NOTE — Discharge Instructions (Addendum)
Take prednisone as prescribed Can use cough syrup as needed Recommend Flonase and Mucinex for drainage and congestion  Can continue with Tylenol as needed Rest, drink plenty of fluids
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
## 2022-10-26 NOTE — ED Triage Notes (Addendum)
Sx x 5 days  Body aches-fever-chest congestion-chills- cough   Taking tylenol and ibu alternating every 3 hours.

## 2022-10-30 ENCOUNTER — Telehealth: Payer: 59 | Admitting: Physician Assistant

## 2022-10-30 ENCOUNTER — Other Ambulatory Visit: Payer: Self-pay

## 2022-10-30 DIAGNOSIS — B9689 Other specified bacterial agents as the cause of diseases classified elsewhere: Secondary | ICD-10-CM | POA: Diagnosis not present

## 2022-10-30 DIAGNOSIS — J208 Acute bronchitis due to other specified organisms: Secondary | ICD-10-CM

## 2022-10-30 MED ORDER — AZITHROMYCIN 250 MG PO TABS
ORAL_TABLET | ORAL | 0 refills | Status: AC
  Filled 2022-10-30: qty 6, 5d supply, fill #0

## 2022-10-30 NOTE — Progress Notes (Signed)
E-Visit for Cough  We are sorry that you are not feeling well.  Here is how we plan to help!  Based on your presentation I believe you most likely have A cough due to bacteria.  When patients have a fever and a productive cough with a change in color or increased sputum production, we are concerned about bacterial bronchitis.  If left untreated it can progress to pneumonia.  If your symptoms do not improve with your treatment plan it is important that you contact your provider.   I have prescribed Azithromyin 250 mg: two tablets now and then one tablet daily for 4 additonal days    In addition you may use A non-prescription cough medication called Mucinex DM: take 2 tablets every 12 hours.  From your responses in the eVisit questionnaire you describe inflammation in the upper respiratory tract which is causing a significant cough.  This is commonly called Bronchitis and has four common causes:   Allergies Viral Infections Acid Reflux Bacterial Infection Allergies, viruses and acid reflux are treated by controlling symptoms or eliminating the cause. An example might be a cough caused by taking certain blood pressure medications. You stop the cough by changing the medication. Another example might be a cough caused by acid reflux. Controlling the reflux helps control the cough.  USE OF BRONCHODILATOR ("RESCUE") INHALERS: There is a risk from using your bronchodilator too frequently.  The risk is that over-reliance on a medication which only relaxes the muscles surrounding the breathing tubes can reduce the effectiveness of medications prescribed to reduce swelling and congestion of the tubes themselves.  Although you feel brief relief from the bronchodilator inhaler, your asthma may actually be worsening with the tubes becoming more swollen and filled with mucus.  This can delay other crucial treatments, such as oral steroid medications. If you need to use a bronchodilator inhaler daily, several times  per day, you should discuss this with your provider.  There are probably better treatments that could be used to keep your asthma under control.     HOME CARE Only take medications as instructed by your medical team. Complete the entire course of an antibiotic. Drink plenty of fluids and get plenty of rest. Avoid close contacts especially the very young and the elderly Cover your mouth if you cough or cough into your sleeve. Always remember to wash your hands A steam or ultrasonic humidifier can help congestion.   GET HELP RIGHT AWAY IF: You develop worsening fever. You become short of breath You cough up blood. Your symptoms persist after you have completed your treatment plan MAKE SURE YOU  Understand these instructions. Will watch your condition. Will get help right away if you are not doing well or get worse.    Thank you for choosing an e-visit.  Your e-visit answers were reviewed by a board certified advanced clinical practitioner to complete your personal care plan. Depending upon the condition, your plan could have included both over the counter or prescription medications.  Please review your pharmacy choice. Make sure the pharmacy is open so you can pick up prescription now. If there is a problem, you may contact your provider through [employer] and have the prescription routed to another pharmacy.  Your safety is important to [place]. If you have drug allergies check your prescription carefully.   For the next 24 hours you can use MyChart to ask questions about today's visit, request a non-urgent call back, or ask for a work or school  excuse. You will get an email in the next two days asking about your experience. I hope that your e-visit has been valuable and will speed your recovery.  I have spent 5 minutes in review of e-visit questionnaire, review and updating patient chart, medical decision making and response to patient.   Twilia Yaklin M Kosei Rhodes, PA-C  

## 2022-11-06 ENCOUNTER — Other Ambulatory Visit: Payer: Self-pay

## 2022-11-07 ENCOUNTER — Other Ambulatory Visit: Payer: Self-pay

## 2022-11-15 DIAGNOSIS — D2261 Melanocytic nevi of right upper limb, including shoulder: Secondary | ICD-10-CM | POA: Diagnosis not present

## 2022-11-15 DIAGNOSIS — Z872 Personal history of diseases of the skin and subcutaneous tissue: Secondary | ICD-10-CM | POA: Diagnosis not present

## 2022-11-15 DIAGNOSIS — D2272 Melanocytic nevi of left lower limb, including hip: Secondary | ICD-10-CM | POA: Diagnosis not present

## 2022-11-15 DIAGNOSIS — D225 Melanocytic nevi of trunk: Secondary | ICD-10-CM | POA: Diagnosis not present

## 2022-11-15 DIAGNOSIS — L400 Psoriasis vulgaris: Secondary | ICD-10-CM | POA: Diagnosis not present

## 2022-11-15 DIAGNOSIS — D2262 Melanocytic nevi of left upper limb, including shoulder: Secondary | ICD-10-CM | POA: Diagnosis not present

## 2022-11-26 ENCOUNTER — Institutional Professional Consult (permissible substitution): Payer: 59 | Admitting: Plastic Surgery

## 2023-01-14 DIAGNOSIS — E079 Disorder of thyroid, unspecified: Secondary | ICD-10-CM | POA: Diagnosis not present

## 2023-01-21 ENCOUNTER — Other Ambulatory Visit: Payer: Self-pay

## 2023-01-21 DIAGNOSIS — Z136 Encounter for screening for cardiovascular disorders: Secondary | ICD-10-CM | POA: Diagnosis not present

## 2023-01-21 DIAGNOSIS — F411 Generalized anxiety disorder: Secondary | ICD-10-CM | POA: Diagnosis not present

## 2023-01-21 DIAGNOSIS — Z131 Encounter for screening for diabetes mellitus: Secondary | ICD-10-CM | POA: Diagnosis not present

## 2023-01-21 DIAGNOSIS — K219 Gastro-esophageal reflux disease without esophagitis: Secondary | ICD-10-CM | POA: Diagnosis not present

## 2023-01-21 DIAGNOSIS — E079 Disorder of thyroid, unspecified: Secondary | ICD-10-CM | POA: Diagnosis not present

## 2023-01-21 MED ORDER — LEVOTHYROXINE SODIUM 125 MCG PO TABS
125.0000 ug | ORAL_TABLET | Freq: Every morning | ORAL | 1 refills | Status: DC
  Filled 2023-01-21 – 2023-02-04 (×5): qty 90, 90d supply, fill #0
  Filled 2023-04-29: qty 90, 90d supply, fill #1

## 2023-01-22 ENCOUNTER — Other Ambulatory Visit: Payer: Self-pay

## 2023-01-23 ENCOUNTER — Other Ambulatory Visit: Payer: Self-pay

## 2023-01-23 MED ORDER — OMEPRAZOLE 40 MG PO CPDR
40.0000 mg | DELAYED_RELEASE_CAPSULE | Freq: Two times a day (BID) | ORAL | 2 refills | Status: DC
  Filled 2023-01-23: qty 60, 30d supply, fill #0
  Filled 2023-04-14: qty 60, 30d supply, fill #1
  Filled 2023-07-19: qty 60, 30d supply, fill #2

## 2023-01-24 ENCOUNTER — Other Ambulatory Visit: Payer: Self-pay

## 2023-01-27 ENCOUNTER — Other Ambulatory Visit: Payer: Self-pay

## 2023-02-04 ENCOUNTER — Other Ambulatory Visit: Payer: Self-pay

## 2023-04-23 ENCOUNTER — Other Ambulatory Visit: Payer: Self-pay | Admitting: Obstetrics and Gynecology

## 2023-04-23 ENCOUNTER — Other Ambulatory Visit: Payer: Self-pay

## 2023-04-24 ENCOUNTER — Other Ambulatory Visit: Payer: Self-pay

## 2023-04-24 ENCOUNTER — Other Ambulatory Visit: Payer: Self-pay | Admitting: Obstetrics and Gynecology

## 2023-04-28 ENCOUNTER — Other Ambulatory Visit: Payer: Self-pay | Admitting: Obstetrics and Gynecology

## 2023-04-28 ENCOUNTER — Other Ambulatory Visit: Payer: Self-pay

## 2023-04-29 ENCOUNTER — Other Ambulatory Visit: Payer: Self-pay

## 2023-04-29 MED FILL — Drospirenone-Ethinyl Estradiol Tab 3-0.02 MG: ORAL | 84 days supply | Qty: 84 | Fill #0 | Status: AC
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
## 2023-06-04 ENCOUNTER — Encounter: Payer: Self-pay | Admitting: Certified Nurse Midwife

## 2023-06-04 ENCOUNTER — Other Ambulatory Visit: Payer: Self-pay

## 2023-06-04 ENCOUNTER — Ambulatory Visit (INDEPENDENT_AMBULATORY_CARE_PROVIDER_SITE_OTHER): Admitting: Certified Nurse Midwife

## 2023-06-04 VITALS — BP 118/80 | HR 83 | Ht 64.0 in | Wt 177.0 lb

## 2023-06-04 DIAGNOSIS — Z01419 Encounter for gynecological examination (general) (routine) without abnormal findings: Secondary | ICD-10-CM | POA: Diagnosis not present

## 2023-06-04 MED ORDER — DROSPIRENONE-ETHINYL ESTRADIOL 3-0.02 MG PO TABS
1.0000 | ORAL_TABLET | Freq: Every day | ORAL | 3 refills | Status: AC
  Filled 2023-06-04 – 2023-07-19 (×2): qty 84, 84d supply, fill #0
  Filled 2023-10-13: qty 84, 84d supply, fill #1
  Filled 2024-01-06: qty 84, 84d supply, fill #2

## 2023-06-04 NOTE — Patient Instructions (Signed)

## 2023-07-16 DIAGNOSIS — K219 Gastro-esophageal reflux disease without esophagitis: Secondary | ICD-10-CM | POA: Diagnosis not present

## 2023-07-16 DIAGNOSIS — Z131 Encounter for screening for diabetes mellitus: Secondary | ICD-10-CM | POA: Diagnosis not present

## 2023-07-16 DIAGNOSIS — E079 Disorder of thyroid, unspecified: Secondary | ICD-10-CM | POA: Diagnosis not present

## 2023-07-16 DIAGNOSIS — F411 Generalized anxiety disorder: Secondary | ICD-10-CM | POA: Diagnosis not present

## 2023-07-16 DIAGNOSIS — Z136 Encounter for screening for cardiovascular disorders: Secondary | ICD-10-CM | POA: Diagnosis not present

## 2023-07-20 ENCOUNTER — Other Ambulatory Visit: Payer: Self-pay

## 2023-07-23 DIAGNOSIS — Z1331 Encounter for screening for depression: Secondary | ICD-10-CM | POA: Diagnosis not present

## 2023-07-23 DIAGNOSIS — K219 Gastro-esophageal reflux disease without esophagitis: Secondary | ICD-10-CM | POA: Diagnosis not present

## 2023-07-23 DIAGNOSIS — Z6830 Body mass index (BMI) 30.0-30.9, adult: Secondary | ICD-10-CM | POA: Diagnosis not present

## 2023-07-23 DIAGNOSIS — E66811 Obesity, class 1: Secondary | ICD-10-CM | POA: Diagnosis not present

## 2023-07-23 DIAGNOSIS — E781 Pure hyperglyceridemia: Secondary | ICD-10-CM | POA: Diagnosis not present

## 2023-07-23 DIAGNOSIS — E6609 Other obesity due to excess calories: Secondary | ICD-10-CM | POA: Diagnosis not present

## 2023-07-23 DIAGNOSIS — Z0000 Encounter for general adult medical examination without abnormal findings: Secondary | ICD-10-CM | POA: Diagnosis not present

## 2023-07-23 DIAGNOSIS — E079 Disorder of thyroid, unspecified: Secondary | ICD-10-CM | POA: Diagnosis not present

## 2023-07-25 ENCOUNTER — Other Ambulatory Visit: Payer: Self-pay

## 2023-07-25 MED ORDER — LEVOTHYROXINE SODIUM 125 MCG PO TABS
125.0000 ug | ORAL_TABLET | Freq: Every morning | ORAL | 1 refills | Status: DC
  Filled 2023-07-25: qty 90, 90d supply, fill #0
  Filled 2023-10-24: qty 90, 90d supply, fill #1

## 2023-10-24 ENCOUNTER — Encounter: Payer: Self-pay | Admitting: Pharmacist

## 2023-10-24 ENCOUNTER — Other Ambulatory Visit: Payer: Self-pay

## 2023-10-25 ENCOUNTER — Encounter (HOSPITAL_COMMUNITY): Payer: Self-pay

## 2023-10-25 ENCOUNTER — Other Ambulatory Visit (HOSPITAL_COMMUNITY): Payer: Self-pay

## 2023-10-27 ENCOUNTER — Other Ambulatory Visit (HOSPITAL_COMMUNITY): Payer: Self-pay

## 2023-10-28 ENCOUNTER — Encounter (HOSPITAL_COMMUNITY): Payer: Self-pay

## 2023-10-28 ENCOUNTER — Other Ambulatory Visit (HOSPITAL_COMMUNITY): Payer: Self-pay

## 2023-11-24 ENCOUNTER — Other Ambulatory Visit (HOSPITAL_COMMUNITY): Payer: Self-pay

## 2023-11-25 DIAGNOSIS — L400 Psoriasis vulgaris: Secondary | ICD-10-CM | POA: Diagnosis not present

## 2023-11-25 DIAGNOSIS — D1801 Hemangioma of skin and subcutaneous tissue: Secondary | ICD-10-CM | POA: Diagnosis not present

## 2023-11-28 ENCOUNTER — Other Ambulatory Visit (HOSPITAL_COMMUNITY): Payer: Self-pay

## 2023-11-28 MED ORDER — OMEPRAZOLE 40 MG PO CPDR
DELAYED_RELEASE_CAPSULE | ORAL | 1 refills | Status: AC
  Filled 2023-11-28 – 2024-01-02 (×2): qty 90, 90d supply, fill #0

## 2024-01-02 ENCOUNTER — Other Ambulatory Visit: Payer: Self-pay

## 2024-01-02 ENCOUNTER — Other Ambulatory Visit (HOSPITAL_BASED_OUTPATIENT_CLINIC_OR_DEPARTMENT_OTHER): Payer: Self-pay

## 2024-01-06 ENCOUNTER — Other Ambulatory Visit (HOSPITAL_COMMUNITY): Payer: Self-pay

## 2024-01-19 ENCOUNTER — Other Ambulatory Visit (HOSPITAL_COMMUNITY): Payer: Self-pay

## 2024-01-21 ENCOUNTER — Other Ambulatory Visit: Payer: Self-pay

## 2024-01-21 ENCOUNTER — Other Ambulatory Visit (HOSPITAL_COMMUNITY): Payer: Self-pay

## 2024-01-21 MED ORDER — LEVOTHYROXINE SODIUM 125 MCG PO TABS
125.0000 ug | ORAL_TABLET | Freq: Every morning | ORAL | 1 refills | Status: DC
  Filled 2024-01-21: qty 90, 90d supply, fill #0

## 2024-01-28 ENCOUNTER — Other Ambulatory Visit: Payer: Self-pay

## 2024-01-28 MED ORDER — LEVOTHYROXINE SODIUM 137 MCG PO TABS
137.0000 ug | ORAL_TABLET | Freq: Every day | ORAL | 1 refills | Status: AC
  Filled 2024-01-29: qty 90, 90d supply, fill #0
  Filled 2024-01-29: qty 100, 100d supply, fill #0

## 2024-01-29 ENCOUNTER — Other Ambulatory Visit: Payer: Self-pay
